# Patient Record
Sex: Male | Born: 1975 | Race: Black or African American | Hispanic: No | Marital: Single | Smoking: Current every day smoker
Health system: Southern US, Community
[De-identification: ages and names within clinical notes are randomized; demographics above are authoritative.]

## PROBLEM LIST (undated history)

## (undated) DIAGNOSIS — F4310 Post-traumatic stress disorder, unspecified: Secondary | ICD-10-CM

## (undated) DIAGNOSIS — I10 Essential (primary) hypertension: Secondary | ICD-10-CM

## (undated) DIAGNOSIS — R569 Unspecified convulsions: Secondary | ICD-10-CM

---

## 2019-11-23 ENCOUNTER — Other Ambulatory Visit: Payer: Self-pay

## 2019-11-23 ENCOUNTER — Emergency Department (HOSPITAL_COMMUNITY)
Admission: EM | Admit: 2019-11-23 | Discharge: 2019-11-25 | Disposition: A | Discharge status: Other Acute Inpatient Hospital | Payer: No Typology Code available for payment source | Attending: Emergency Medicine | Admitting: Emergency Medicine

## 2019-11-23 ENCOUNTER — Emergency Department (HOSPITAL_COMMUNITY): Payer: No Typology Code available for payment source

## 2019-11-23 ENCOUNTER — Encounter (HOSPITAL_COMMUNITY): Payer: Self-pay | Admitting: Emergency Medicine

## 2019-11-23 DIAGNOSIS — F209 Schizophrenia, unspecified: Secondary | ICD-10-CM | POA: Insufficient documentation

## 2019-11-23 DIAGNOSIS — F4310 Post-traumatic stress disorder, unspecified: Secondary | ICD-10-CM | POA: Insufficient documentation

## 2019-11-23 DIAGNOSIS — F17200 Nicotine dependence, unspecified, uncomplicated: Secondary | ICD-10-CM | POA: Diagnosis not present

## 2019-11-23 DIAGNOSIS — I10 Essential (primary) hypertension: Secondary | ICD-10-CM | POA: Diagnosis not present

## 2019-11-23 DIAGNOSIS — Z20822 Contact with and (suspected) exposure to covid-19: Secondary | ICD-10-CM | POA: Diagnosis not present

## 2019-11-23 DIAGNOSIS — Z79899 Other long term (current) drug therapy: Secondary | ICD-10-CM | POA: Insufficient documentation

## 2019-11-23 DIAGNOSIS — R45851 Suicidal ideations: Secondary | ICD-10-CM | POA: Diagnosis not present

## 2019-11-23 DIAGNOSIS — R079 Chest pain, unspecified: Secondary | ICD-10-CM | POA: Diagnosis not present

## 2019-11-23 HISTORY — DX: Post-traumatic stress disorder, unspecified: F43.10

## 2019-11-23 HISTORY — DX: Essential (primary) hypertension: I10

## 2019-11-23 HISTORY — DX: Unspecified convulsions: R56.9

## 2019-11-23 LAB — RAPID URINE DRUG SCREEN, HOSP PERFORMED
Amphetamines: NOT DETECTED
Barbiturates: NOT DETECTED
Benzodiazepines: NOT DETECTED
Cocaine: NOT DETECTED
Opiates: NOT DETECTED
Tetrahydrocannabinol: POSITIVE — AB

## 2019-11-23 LAB — COMPREHENSIVE METABOLIC PANEL
ALT: 77 U/L — ABNORMAL HIGH (ref 0–44)
AST: 254 U/L — ABNORMAL HIGH (ref 15–41)
Albumin: 4.9 g/dL (ref 3.5–5.0)
Alkaline Phosphatase: 73 U/L (ref 38–126)
Anion gap: 16 — ABNORMAL HIGH (ref 5–15)
BUN: 8 mg/dL (ref 6–20)
CO2: 24 mmol/L (ref 22–32)
Calcium: 8.9 mg/dL (ref 8.9–10.3)
Chloride: 102 mmol/L (ref 98–111)
Creatinine, Ser: 0.97 mg/dL (ref 0.61–1.24)
GFR, Estimated: >60 mL/min (ref >60)
Glucose, Bld: 122 mg/dL — ABNORMAL HIGH (ref 70–99)
Potassium: 2.9 mmol/L — ABNORMAL LOW (ref 3.5–5.1)
Sodium: 142 mmol/L (ref 135–145)
Total Bilirubin: 0.6 mg/dL (ref 0.3–1.2)
Total Protein: 8.3 g/dL — ABNORMAL HIGH (ref 6.5–8.1)

## 2019-11-23 LAB — CBC WITH DIFFERENTIAL/PLATELET
Abs Immature Granulocytes: 0.01 10*3/uL (ref 0.00–0.07)
Basophils Absolute: 0 10*3/uL (ref 0.0–0.1)
Basophils Relative: 0 %
Eosinophils Absolute: 0.1 10*3/uL (ref 0.0–0.5)
Eosinophils Relative: 1 %
HCT: 46.5 % (ref 39.0–52.0)
Hemoglobin: 15.4 g/dL (ref 13.0–17.0)
Immature Granulocytes: 0 %
Lymphocytes Relative: 38 %
Lymphs Abs: 4 10*3/uL (ref 0.7–4.0)
MCH: 25.2 pg — ABNORMAL LOW (ref 26.0–34.0)
MCHC: 33.1 g/dL (ref 30.0–36.0)
MCV: 76 fL — ABNORMAL LOW (ref 80.0–100.0)
Monocytes Absolute: 0.8 10*3/uL (ref 0.1–1.0)
Monocytes Relative: 8 %
Neutro Abs: 5.6 10*3/uL (ref 1.7–7.7)
Neutrophils Relative %: 53 %
Platelets: 229 10*3/uL (ref 150–400)
RBC: 6.12 MIL/uL — ABNORMAL HIGH (ref 4.22–5.81)
RDW: 17.9 % — ABNORMAL HIGH (ref 11.5–15.5)
WBC: 10.6 10*3/uL — ABNORMAL HIGH (ref 4.0–10.5)
nRBC: 0 % (ref 0.0–0.2)

## 2019-11-23 LAB — RESPIRATORY PANEL BY RT PCR (FLU A&B, COVID)
Influenza A by PCR: NEGATIVE
Influenza B by PCR: NEGATIVE
SARS Coronavirus 2 by RT PCR: NEGATIVE

## 2019-11-23 LAB — ETHANOL: Alcohol, Ethyl (B): 254 mg/dL — ABNORMAL HIGH (ref <10)

## 2019-11-23 LAB — TROPONIN I (HIGH SENSITIVITY)
Troponin I (High Sensitivity): 33 ng/L — ABNORMAL HIGH (ref <18)
Troponin I (High Sensitivity): 31 ng/L — ABNORMAL HIGH (ref <18)

## 2019-11-23 MED ORDER — LORAZEPAM 1 MG PO TABS
2.0000 mg | ORAL_TABLET | Freq: Once | ORAL | Status: AC
  Administered 2019-11-23: 2 mg via ORAL
  Filled 2019-11-23: qty 2

## 2019-11-23 MED ORDER — AMLODIPINE BESYLATE 5 MG PO TABS
10.0000 mg | ORAL_TABLET | Freq: Every day | ORAL | Status: DC
  Administered 2019-11-24 – 2019-11-25 (×2): 10 mg via ORAL
  Filled 2019-11-23 (×2): qty 2

## 2019-11-23 MED ORDER — PANTOPRAZOLE SODIUM 40 MG PO TBEC
40.0000 mg | DELAYED_RELEASE_TABLET | Freq: Every day | ORAL | Status: DC
  Administered 2019-11-24 – 2019-11-25 (×2): 40 mg via ORAL
  Filled 2019-11-23 (×2): qty 1

## 2019-11-23 MED ORDER — PRAZOSIN HCL 5 MG PO CAPS
5.0000 mg | ORAL_CAPSULE | Freq: Every day | ORAL | Status: DC
  Administered 2019-11-23 – 2019-11-24 (×2): 5 mg via ORAL
  Filled 2019-11-23 (×5): qty 1

## 2019-11-23 MED ORDER — AMITRIPTYLINE HCL 25 MG PO TABS
12.5000 mg | ORAL_TABLET | Freq: Two times a day (BID) | ORAL | Status: DC
  Administered 2019-11-23 – 2019-11-24 (×3): 12.5 mg via ORAL
  Filled 2019-11-23 (×11): qty 0.5

## 2019-11-23 MED ORDER — TRAZODONE HCL 50 MG PO TABS
100.0000 mg | ORAL_TABLET | Freq: Every day | ORAL | Status: DC
  Administered 2019-11-23 – 2019-11-24 (×2): 100 mg via ORAL
  Filled 2019-11-23 (×2): qty 2

## 2019-11-23 MED ORDER — FOLIC ACID 1 MG PO TABS
1.0000 mg | ORAL_TABLET | Freq: Every day | ORAL | Status: DC
  Administered 2019-11-24 – 2019-11-25 (×2): 1 mg via ORAL
  Filled 2019-11-23 (×2): qty 1

## 2019-11-23 MED ORDER — SERTRALINE HCL 50 MG PO TABS
100.0000 mg | ORAL_TABLET | Freq: Every day | ORAL | Status: DC
  Administered 2019-11-24 – 2019-11-25 (×2): 100 mg via ORAL
  Filled 2019-11-23 (×2): qty 2

## 2019-11-23 MED ORDER — CYCLOBENZAPRINE HCL 10 MG PO TABS: 10.0000 mg | ORAL_TABLET | Freq: Three times a day (TID) | ORAL | Status: DC | PRN

## 2019-11-23 MED ORDER — ROSUVASTATIN CALCIUM 5 MG PO TABS
5.0000 mg | ORAL_TABLET | Freq: Every day | ORAL | Status: DC
  Administered 2019-11-24 – 2019-11-25 (×2): 5 mg via ORAL
  Filled 2019-11-23 (×5): qty 1

## 2019-11-23 MED ORDER — NALTREXONE HCL 50 MG PO TABS
50.0000 mg | ORAL_TABLET | Freq: Every day | ORAL | Status: DC
  Administered 2019-11-24 – 2019-11-25 (×2): 50 mg via ORAL
  Filled 2019-11-23 (×5): qty 1

## 2019-11-23 MED ORDER — POTASSIUM CHLORIDE CRYS ER 20 MEQ PO TBCR
40.0000 meq | EXTENDED_RELEASE_TABLET | Freq: Once | ORAL | Status: AC
  Administered 2019-11-23: 40 meq via ORAL
  Filled 2019-11-23: qty 2

## 2019-11-23 MED ORDER — CARVEDILOL 12.5 MG PO TABS
12.5000 mg | ORAL_TABLET | Freq: Two times a day (BID) | ORAL | Status: DC
  Administered 2019-11-24 – 2019-11-25 (×3): 12.5 mg via ORAL
  Filled 2019-11-23 (×3): qty 1

## 2019-11-23 NOTE — ED Notes (Signed)
Called AC for meds.  

## 2019-11-23 NOTE — ED Provider Notes (Signed)
[facility] EMERGENCY DEPARTMENT Provider Note   CSN: [id number] Arrival date & time: 11/23/19  1913     History Chief Complaint  Patient presents with  . Suicidal    Jonathan Gomez is a 44 y.o. male.  Patient presented with suicidal ideations. While he was in the emergency department he had mild chest discomfort.  The history is provided by the patient and medical records. No language interpreter was used.  Altered Mental Status Presenting symptoms: behavior changes   Severity:  Moderate Most recent episode:  Today Episode history:  Continuous Timing:  Constant Progression:  Waxing and waning Chronicity:  New Context: not alcohol use   Associated symptoms: no abdominal pain, no hallucinations, no headaches, no rash and no seizures        Past Medical History:  Diagnosis Date  . Hypertension   . PTSD (post-traumatic stress disorder)   . Seizures (HCC)     There are no problems to display for this patient.   History reviewed. No pertinent surgical history.     No family history on file.  Social History   Tobacco Use  . Smoking status: Current Every Day Smoker  Substance Use Topics  . Alcohol use: Yes  . Drug use: Not on file    Home Medications Prior to Admission medications   Medication Sig Start Date End Date Taking? Authorizing Provider  amLODipine (NORVASC) 10 MG tablet Take by mouth. 03/25/18  Yes [provider]  carvedilol (COREG) 12.5 MG tablet Take 12.5 mg by mouth 2 (two) times daily with a meal.   Yes [provider]  cyclobenzaprine (FLEXERIL) 10 MG tablet Take 10 mg by mouth 3 (three) times daily as needed for muscle spasms.   Yes [provider]  folic acid (FOLVITE) 1 MG tablet Take by mouth. 03/25/18  Yes [provider]  naltrexone (DEPADE) 50 MG tablet Take 50 mg by mouth daily.   Yes [provider]  omeprazole (PRILOSEC) 20 MG capsule Take 20 mg by mouth daily.   Yes [provider]   sertraline (ZOLOFT) 100 MG tablet Take by mouth.   Yes [provider]  traZODone (DESYREL) 100 MG tablet Take 100 mg by mouth at bedtime.   Yes [provider]  amitriptyline (ELAVIL) 25 MG tablet Take 25 mg by mouth See admin instructions. Take 1/2 tablet by mouth twice a day at 7am and 9 pm    [provider]  prazosin (MINIPRESS) 5 MG capsule Take by mouth.    [provider]  rosuvastatin (CRESTOR) 5 MG tablet Take by mouth.    [provider]    Allergies    Lisinopril  Review of Systems   Review of Systems  Constitutional: Negative for appetite change and fatigue.  HENT: Negative for congestion, ear discharge and sinus pressure.   Eyes: Negative for discharge.  Respiratory: Negative for cough.   Cardiovascular: Positive for chest pain.  Gastrointestinal: Negative for abdominal pain and diarrhea.  Genitourinary: Negative for frequency and hematuria.  Musculoskeletal: Negative for back pain.  Skin: Negative for rash.  Neurological: Negative for seizures and headaches.  Psychiatric/Behavioral: Positive for dysphoric mood. Negative for hallucinations.    Physical Exam Updated Vital Signs BP (!) 151/107 (BP Location: Right Arm)   Pulse (!) 118   Temp 98.2 F (36.8 C) (Oral)   Resp 20   Ht 5\' 10"  (1.778 m)   Wt 117.9 kg   SpO2 95%   BMI 37.31 kg/m  Physical Exam Vitals and nursing note reviewed.  Constitutional:      Appearance: He is well-developed.  HENT:     Head: Normocephalic.     Nose: Nose normal.  Eyes:     General: No scleral icterus.    Conjunctiva/sclera: Conjunctivae normal.  Neck:     Thyroid: No thyromegaly.  Cardiovascular:     Rate and Rhythm: Normal rate and regular rhythm.     Heart sounds: No murmur heard.  No friction rub. No gallop.   Pulmonary:     Breath sounds: No stridor. No wheezing or rales.  Chest:     Chest wall: No tenderness.  Abdominal:     General: There is no distension.      Tenderness: There is no abdominal tenderness. There is no rebound.  Musculoskeletal:        General: Normal range of motion.     Cervical back: Neck supple.  Lymphadenopathy:     Cervical: No cervical adenopathy.  Skin:    Findings: No erythema or rash.  Neurological:     Mental Status: He is alert and oriented to person, place, and time.     Motor: No abnormal muscle tone.     Coordination: Coordination normal.  Psychiatric:     Comments: Suicidal.     ED Results / Procedures / Treatments   Labs (all labs ordered are listed, but only abnormal results are displayed) Labs Reviewed  CBC WITH DIFFERENTIAL/PLATELET - Abnormal; Notable for the following components:      Result Value   WBC 10.6 (*)    RBC 6.12 (*)    MCV 76.0 (*)    MCH 25.2 (*)    RDW 17.9 (*)    All other components within normal limits  COMPREHENSIVE METABOLIC PANEL - Abnormal; Notable for the following components:   Potassium 2.9 (*)    Glucose, Bld 122 (*)    Total Protein 8.3 (*)    AST 254 (*)    ALT 77 (*)    Anion gap 16 (*)    All other components within normal limits  ETHANOL - Abnormal; Notable for the following components:   Alcohol, Ethyl (B) 254 (*)    All other components within normal limits  RAPID URINE DRUG SCREEN, HOSP PERFORMED - Abnormal; Notable for the following components:   Tetrahydrocannabinol POSITIVE (*)    All other components within normal limits  TROPONIN I (HIGH SENSITIVITY) - Abnormal; Notable for the following components:   Troponin I (High Sensitivity) 33 (*)    All other components within normal limits  RESPIRATORY PANEL BY RT PCR (FLU A&B, COVID)  TROPONIN I (HIGH SENSITIVITY)    EKG None  Radiology DG Chest Port 1 View  Result Date: 11/23/2019 CLINICAL DATA:  Short of breath, chest pain, hypertension, tobacco abuse EXAM: PORTABLE CHEST 1 VIEW COMPARISON:  None. FINDINGS: The heart size and mediastinal contours are within normal limits. Both lungs are clear. The  visualized skeletal structures are unremarkable. IMPRESSION: No active disease. Electronically Signed   By: Sharlet Salina M.D.   On: 11/23/2019 21:14    Procedures Procedures (including critical care time)  Medications Ordered in ED Medications  potassium chloride SA (KLOR-CON) CR tablet 40 mEq (has no administration in time range)  amitriptyline (ELAVIL) tablet 12.5 mg (has no administration in time range)  amLODipine (NORVASC) tablet 10 mg (has no administration in time range)  carvedilol (COREG) tablet 12.5 mg (has no administration in time range)  cyclobenzaprine (FLEXERIL) tablet 10 mg (has no administration in time range)  folic acid (FOLVITE) tablet 1 mg (has no administration in time range)  naltrexone (DEPADE) tablet 50 mg (has no administration in time range)  pantoprazole (PROTONIX) EC tablet 40 mg (has no administration in time range)  prazosin (MINIPRESS) capsule 5 mg (has no administration in time range)  rosuvastatin (CRESTOR) tablet 5 mg (has no administration in time range)  sertraline (ZOLOFT) tablet 100 mg (has no administration in time range)  traZODone (DESYREL) tablet 100 mg (has no administration in time range)  LORazepam (ATIVAN) tablet 2 mg (2 mg Oral Given 11/23/19 2024)    ED Course  I have reviewed the triage vital signs and the nursing notes.  Pertinent labs & imaging results that were available during my care of the patient were reviewed by me and considered in my medical decision making (see chart for details).    MDM Rules/Calculators/A&P                          Patient with suicidal ideations and chest discomfort with mild elevated troponin.  Patient will be observed in the emergency department tonight and have serial troponins done.  If he is medically cleared he will be seen by.  Behavior health for his suicidal ideations      This patient presents to the ED for concern of suicidal ideation, this involves an extensive number of treatment  options, and is a complaint that carries with it a high risk of complications and morbidity.  The differential diagnosis includes suicidal   Lab Tests:   I Ordered, reviewed, and interpreted labs, which included CBC chemistries troponin which showed mild elevated troponin hypokalemia  Medicines ordered:   I ordered medication potassium for hypokalemia  Imaging Studies ordered:   I ordered imaging studies which included chest x-ray  I independently visualized and interpreted imaging which showed unremarkable  Additional history obtained:   Additional history obtained from records  Previous records obtained and reviewed.  Consultations Obtained:   I consulted behavioral health and discussed lab and imaging findings  Reevaluation:  After the interventions stated above, I reevaluated the patient and found unchanged  Critical Interventions:  .   Final Clinical Impression(s) / ED Diagnoses Final diagnoses:  None    Rx / DC Orders ED Discharge Orders    None       Bethann Berkshire, MD 11/27/19 1020

## 2019-11-23 NOTE — BH Assessment (Signed)
Comprehensive Clinical Assessment (CCA) Note  11/23/2019 Jonathan Gomez [id number]   Jael Kostick is a 44 year old male who presents to [facility] voluntarily for suicidal thoughts and PTSD. During assessment pt presents to be thought blocking and paranoia but pleasant and provided limited information. Pt states that he has had suicidal thoughts since this past weekend, states he is not sure what triggered thoughts but he does admit to drinking alcohol past few days, states he relapsed has not drank since February 2021. Pt admits to drinking a 5th of liquor and yesterday he states he drank a gallon of liquor. Pt denies HI but admits to [facility] states he hears voices telling him to kill self and admits that he had planned to drown himself this week with SI thoughts. Pt reports he has PTSD from [employer], he was in service for 24 years, also has TBI and history of seizures. Pt states his provider is the [place], states he has not been to [place] since June 2021 and states he is taking several medications including Prozac daily. Pt reports getting only 4 hours of sleep daily and poor appetite. Pt reports that he would like inpatient treatment for detox as well. Pt gave TTS permission to speak with spouse for collateral information, Coy Saunas at [phone], she states that pt has a hx of schizophrenia and PTSD, states that his schizophrenia is only triggered when he drinks alcohol. She states that pt has been experiencing suicidal thoughts last few weeks and threatened to kill himself as well as her. She states he had  Plan to drown himself and or shoot self with firearm. She states that pt is med compliant daily, she is not sure what triggered his SI thoughts, states he has been drinking more no other triggers. Pt seeking inpatient treatment and spouse in agreement, states pt has not had an episode in the last year she is concerned.   Diagnosis: PTSD         Schizophrenia  Disposition: Nira Conn FNP recommends pt  for inpatient treatment as well as Social Work consult pt requesting to go to [place]. Per [facility] no appropiate bed at [facility] at this time.   Chief Complaint:  Chief Complaint  Patient presents with  . Suicidal   Visit Diagnosis: suicdal, PTSD   CCA Screening, Triage and Referral (STR)  Patient Reported Information How did you hear about [place]? Family/Friend  Referral name: Spouse (Spouse)  Referral phone number: No data recorded  Whom do you see for routine medical problems? Primary Care  Practice/Facility Name: [facility])  Practice/Facility Phone Number: No data recorded Name of Contact: No data recorded Contact Number: No data recorded Contact Fax Number: No data recorded Prescriber Name: No data recorded Prescriber Address (if known): No data recorded  What Is the Reason for Your Visit/Call Today? Suicidal and detox from alcohol (Suicidal and detox from alcohol)  How Long Has This Been Causing You Problems? <Week  What Do You Feel Would Help You the Most Today? Therapy;Medication   Have You Recently Been in Any Inpatient Treatment (Hospital/Detox/Crisis Center/28-Day Program)? No  Name/Location of Program/Hospital:No data recorded How Long Were You There? No data recorded When Were You Discharged? No data recorded  Have You Ever Received Services From [facility] Before? No  Who Do You See at [facility]? No data recorded  Have You Recently Had Any Thoughts About Hurting Yourself? Yes  Are You Planning to Commit Suicide/Harm Yourself At This time? Yes  Have you Recently Had Thoughts About Hurting Someone Karolee Ohs? No  Explanation: No data recorded  Have You Used Any Alcohol or Drugs in the Past 24 Hours? Yes  How Long Ago Did You Use Drugs or Alcohol? No data recorded What Did You Use and How Much? 1/5th of liquor (1/5th of liquor)   Do You Currently Have a Therapist/Psychiatrist? Yes  [facility]   Have You Been  Recently Discharged From Any Office Practice or Programs? No  Explanation of Discharge From Practice/Program: No data recorded    CCA Screening Triage Referral Assessment Type of Contact: Tele-Assessment  Is this Initial or Reassessment? Initial Assessment  Date Telepsych consult ordered in [facility]:  11/23/19 (11/23/2019)  Time Telepsych consult ordered in [facility]:  No data recorded  Patient Reported Information Reviewed? Yes  Patient Left Without Being Seen? No data recorded Reason for Not Completing Assessment: No data recorded  Collateral Involvement: spouse/girlfriend (spouse/girlfriend)   Does Patient Have a Court Appointed Legal Guardian? No data recorded Name and Contact of Legal Guardian: No data recorded If Minor and Not Living with Parent(s), Who has Custody? No data recorded Is CPS involved or ever been involved? Never  Is APS involved or ever been involved? Never   Patient Determined To Be At Risk for Harm To Self or Others Based on Review of Patient Reported Information or Presenting Complaint? No  Method: No data recorded Availability of Means: No data recorded Intent: No data recorded Notification Required: No data recorded Additional Information for Danger to Others Potential: No data recorded Additional Comments for Danger to Others Potential: No data recorded Are There Guns or Other Weapons in Your Home? No data recorded Types of Guns/Weapons: No data recorded Are These Weapons Safely Secured?                            No data recorded Who Could Verify You Are Able To Have These Secured: No data recorded Do You Have any Outstanding Charges, Pending Court Dates, Parole/Probation? No data recorded Contacted To Inform of Risk of Harm To Self or Others: No data recorded  Location of Assessment: [facility]   Does Patient Present under Involuntary Commitment? No  IVC Papers Initial File Date: No data recorded  [place] of Residence: [place]   Patient Currently  Receiving the Following Services: Medication Management   Determination of Need: Urgent (48 hours)   Options For Referral: Other: Comment     CCA Biopsychosocial  Intake/Chief Complaint:  No data recorded  Patient Reported Schizophrenia/Schizoaffective Diagnosis in Past: Yes   Mental Health Symptoms Depression:  No data recorded  Duration of Depressive symptoms: No data recorded  Mania:  Change in energy/activity   Anxiety:   No data recorded  Psychosis:  Hallucinations;Other negative symptoms   Duration of Psychotic symptoms: Greater than six months   Trauma:  Hypervigilance;Irritability/anger;Re-experience of traumatic event;Difficulty staying/falling asleep   Obsessions:  No data recorded  Compulsions:  No data recorded  Inattention:  No data recorded  Hyperactivity/Impulsivity:  No data recorded  Oppositional/Defiant Behaviors:  No data recorded  Emotional Irregularity:  No data recorded  Other Mood/Personality Symptoms:  No data recorded   Mental Status Exam Appearance and self-care  Stature:  Average   Weight:  Average weight   Clothing:  Casual   Grooming:  Normal   Cosmetic use:  Age appropriate   Posture/gait:  Normal   Motor activity:  No  data recorded  Sensorium  Attention:  Distractible   Concentration:  Focuses on irrelevancies   Orientation:  Object;Person   Recall/memory:  Normal   Affect and Mood  Affect:  Other (Comment)   Mood:  Euphoric   Relating  Eye contact:  Staring   Facial expression:  Responsive   Attitude toward examiner:  Nutritional therapistarcastic;Dramatic   Thought and Language  Speech flow: Flight of Ideas;Other (Comment);Blocked   Thought content:  Appropriate to Mood and Circumstances   Preoccupation:  Other (Comment)   Hallucinations:  Auditory;Visual;Other (Comment)   Organization:  No data recorded  Affiliated Computer ServicesExecutive Functions  Fund of Knowledge:  Fair   Intelligence:  Average   Abstraction:  Functional   Judgement:   Fair   Reality Testing:  Variable   Insight:  Gaps;Fair   Decision Making:  Vacilates   Social Functioning  Social Maturity:  Irresponsible   Social Judgement:  Normal   Stress  Stressors:  Other (Comment)   Coping Ability:  Normal   Skill Deficits:  None   Supports:  Family      Religion: Religion/Spirituality Are You A Religious Person?: Yes What is Your Religious Affiliation?: Environmental consultantBaptist  Leisure/Recreation: Leisure / Recreation Do You Have Hobbies?: Yes Leisure and Hobbies:  Loss adjuster, chartered(Football)  Exercise/Diet: Exercise/Diet Have You Gained or Lost A Significant Amount of Weight in the Past Six Months?: Yes-Gained Do You Follow a Special Diet?: No Do You Have Any Trouble Sleeping?: Yes Explanation of Sleeping Difficulties: Trauma/nightmares (Trauma)   CCA Employment/Education  Employment/Work Situation: Employment / Work Situation Employment situation: On disability Has patient ever been in the [employer] and Compan[employer]ilitary?: Yes (Describe in comment)  Education: Education Did You Have An Individualized Education Program (IIEP): No Did You Have Any Difficulty At Progress EnergySchool?: No Patient's Education Has Been Impacted by Current Illness: No   CCA Family/Childhood History  Family and Relationship History: Family history Marital status: Long term relationship Does patient have children?: Yes How many children?: 2  Childhood History:  Childhood History By whom was/is the patient raised?: Both parents Does patient have siblings?: Yes Number of Siblings: 2 Did patient suffer any verbal/emotional/physical/sexual abuse as a child?: No Did patient suffer from severe childhood neglect?: No Has patient ever been sexually abused/assaulted/raped as an adolescent or adult?: No Was the patient ever a victim of a crime or a disaster?: No Witnessed domestic violence?: No Has patient been affected by domestic violence as an adult?: No  Child/Adolescent Assessment:     CCA Substance  Use  Alcohol/Drug Use: Alcohol / Drug Use Pain Medications: see MAR History of alcohol / drug use?: Yes Withdrawal Symptoms: Seizures Substance #1 Name of Substance 1:  (Alcohol) 1 - Frequency:  (1/5th of liquor) 1 - Last Use / Amount:  (last 24 hours)      ASAM's:  Six Dimensions of Multidimensional Assessment  Dimension 1:  Acute Intoxication and/or Withdrawal Potential:   Dimension 1:  Description of individual's past and current experiences of substance use and withdrawal: 1  Dimension 2:  Biomedical Conditions and Complications:   Dimension 2:  Description of patient's biomedical conditions and  complications: 2  Dimension 3:  Emotional, Behavioral, or Cognitive Conditions and Complications:  Dimension 3:  Description of emotional, behavioral, or cognitive conditions and complications: 3  Dimension 4:  Readiness to Change:  Dimension 4:  Description of Readiness to Change criteria: 2  Dimension 5:  Relapse, Continued use, or Continued Problem Potential:  Dimension 5:  Relapse, continued use, or continued  problem potential critiera description: 2  Dimension 6:  Recovery/Living Environment:  Dimension 6:  Recovery/Iiving environment criteria description: 2  ASAM Severity Score: ASAM's Severity Rating Score: 12  ASAM Recommended Level of Treatment:     Substance use Disorder (SUD) Substance Use Disorder (SUD)  Checklist Symptoms of Substance Use: Continued use despite having a persistent/recurrent physical/psychological problem caused/exacerbated by use  Recommendations for Services/Supports/Treatments: Recommendations for Services/Supports/Treatments Recommendations For Services/Supports/Treatments: Inpatient Hospitalization  DSM5 Diagnoses: There are no problems to display for this patient.   Patient Centered Plan: Patient is on the following Treatment Plan(s):     Referrals to Alternative Service(s): Referred to Alternative Service(s):   Place:   Date:   Time:     Referred to Alternative Service(s):   Place:   Date:   Time:    Referred to Alternative Service(s):   Place:   Date:   Time:    Referred to Alternative Service(s):   Place:   Date:   Time:       Natasha Mead, LCSWA
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
## 2019-11-23 NOTE — ED Triage Notes (Signed)
Pt states he has been having SI thoughts since Thursday. Pt has extended military history with PTSD.

## 2019-11-24 ENCOUNTER — Emergency Department (HOSPITAL_COMMUNITY): Payer: No Typology Code available for payment source

## 2019-11-24 LAB — TROPONIN I (HIGH SENSITIVITY): Troponin I (High Sensitivity): 29 ng/L — ABNORMAL HIGH (ref <18)

## 2019-11-24 MED ORDER — SODIUM CHLORIDE 0.9 % IV BOLUS
1000.0000 mL | Freq: Once | INTRAVENOUS | Status: AC
  Administered 2019-11-24: 1000 mL via INTRAVENOUS

## 2019-11-24 MED ORDER — IOHEXOL 350 MG/ML SOLN
100.0000 mL | Freq: Once | INTRAVENOUS | Status: AC | PRN
  Administered 2019-11-24: 100 mL via INTRAVENOUS

## 2019-11-24 MED ORDER — NICOTINE 21 MG/24HR TD PT24
21.0000 mg | MEDICATED_PATCH | Freq: Once | TRANSDERMAL | Status: AC
  Administered 2019-11-24: 21 mg via TRANSDERMAL
  Filled 2019-11-24: qty 1

## 2019-11-24 NOTE — ED Provider Notes (Signed)
Patient was signed out to me to follow-up on cardiac enzymes.  Patient was seen earlier, primarily for suicidal ideation.  Involuntary commitment was initiated.  While here in the department, however, he reported he had chest pain.  It was felt that his chest pain was atypical, however first troponin was elevated.  Subsequent troponins have remained steadily elevated but have actually decreased.  This is not consistent with an acute coronary syndrome.  He is tachycardic.  Likely secondary to anxiousness/agitation.  Patient administered IV fluids.  CT chest does not show any obvious PE however was suboptimal opacification calculation unlikely to have a PE.  Patient now medically clear for psychiatric treatment.Gilda Crease, MD 11/24/19 [phone]

## 2019-11-24 NOTE — Progress Notes (Addendum)
CSW contacted admissions at the [place] in [place], [place]. They are currently on mental health capacity alert and are not accepting new pts at this time. It was assessed that pt is linked with the [facility] and a voice message requesting a return phone call was left with admissions at [facility].   Disposition will continue to follow.    Wells Guiles, MSW, LCSW, LCAS Clinical Social Worker II Disposition CSW [phone]    UPDATE: CSW spoke with Steward Drone at the [facility]. She verified that beds are available today. CSW has faxed all needed documentation except the Patient Consent for Transfer form and [facility] [place] Transfer Information form. Meghan, CSW @ [facility] will assist with this. These forms are to be faxed to [phone]  UPDATE 230pm: CSW left message with Steward Drone in admissions at [facility]. [phone] ext. [phone]), requesting a return phone call to discuss . All needed referral information, including IVC has faxed to the [place].   240pm: Received phone call from [place]. [facility] Dortha Schwalbe [phone] ex. [phone]) is the assigned social worker who will be reviewing pt's referral. 2nd shift Disposition CSW to follow up.

## 2019-11-24 NOTE — Clinical Social Work Note (Signed)
CSW faxed copy of IVC paperwork and [facility] MD forms to [facility] [phone]).

## 2019-11-25 ENCOUNTER — Encounter (HOSPITAL_COMMUNITY): Payer: Self-pay | Admitting: Psychiatry

## 2019-11-25 ENCOUNTER — Other Ambulatory Visit: Payer: Self-pay

## 2019-11-25 ENCOUNTER — Inpatient Hospital Stay (HOSPITAL_COMMUNITY)
Admission: AD | Admit: 2019-11-25 | Discharge: 2019-11-28 | DRG: 897 | Disposition: A | Discharge status: Home / Self Care | Payer: No Typology Code available for payment source | Attending: Psychiatry | Admitting: Psychiatry

## 2019-11-25 DIAGNOSIS — F1020 Alcohol dependence, uncomplicated: Principal | ICD-10-CM | POA: Diagnosis present

## 2019-11-25 DIAGNOSIS — R45851 Suicidal ideations: Secondary | ICD-10-CM | POA: Diagnosis present

## 2019-11-25 DIAGNOSIS — K219 Gastro-esophageal reflux disease without esophagitis: Secondary | ICD-10-CM | POA: Diagnosis present

## 2019-11-25 DIAGNOSIS — F17210 Nicotine dependence, cigarettes, uncomplicated: Secondary | ICD-10-CM | POA: Diagnosis present

## 2019-11-25 DIAGNOSIS — I10 Essential (primary) hypertension: Secondary | ICD-10-CM | POA: Diagnosis present

## 2019-11-25 DIAGNOSIS — G4700 Insomnia, unspecified: Secondary | ICD-10-CM | POA: Diagnosis present

## 2019-11-25 DIAGNOSIS — F4310 Post-traumatic stress disorder, unspecified: Secondary | ICD-10-CM | POA: Diagnosis present

## 2019-11-25 DIAGNOSIS — F10188 Alcohol abuse with other alcohol-induced disorder: Secondary | ICD-10-CM | POA: Diagnosis not present

## 2019-11-25 MED ORDER — AMITRIPTYLINE HCL 25 MG PO TABS
12.5000 mg | ORAL_TABLET | Freq: Two times a day (BID) | ORAL | Status: DC
  Administered 2019-11-25: 12.5 mg via ORAL
  Filled 2019-11-25 (×4): qty 0.5
  Filled 2019-11-25: qty 1

## 2019-11-25 MED ORDER — ACETAMINOPHEN 325 MG PO TABS: 650.0000 mg | ORAL_TABLET | Freq: Four times a day (QID) | ORAL | Status: DC | PRN

## 2019-11-25 MED ORDER — MAGNESIUM HYDROXIDE 400 MG/5ML PO SUSP: 30.0000 mL | Freq: Every day | ORAL | Status: DC | PRN

## 2019-11-25 MED ORDER — THIAMINE HCL 100 MG PO TABS
100.0000 mg | ORAL_TABLET | Freq: Every day | ORAL | Status: DC
  Administered 2019-11-25 – 2019-11-28 (×4): 100 mg via ORAL
  Filled 2019-11-25 (×7): qty 1

## 2019-11-25 MED ORDER — ROSUVASTATIN CALCIUM 5 MG PO TABS
5.0000 mg | ORAL_TABLET | Freq: Every day | ORAL | Status: DC
  Administered 2019-11-26 – 2019-11-28 (×3): 5 mg via ORAL
  Filled 2019-11-25 (×5): qty 1

## 2019-11-25 MED ORDER — RISPERIDONE 2 MG PO TBDP: 2.0000 mg | ORAL_TABLET | Freq: Three times a day (TID) | ORAL | Status: DC | PRN

## 2019-11-25 MED ORDER — CARVEDILOL 12.5 MG PO TABS
12.5000 mg | ORAL_TABLET | Freq: Two times a day (BID) | ORAL | Status: DC
  Administered 2019-11-26: 12.5 mg via ORAL
  Filled 2019-11-25 (×4): qty 1

## 2019-11-25 MED ORDER — AMLODIPINE BESYLATE 10 MG PO TABS
10.0000 mg | ORAL_TABLET | Freq: Every day | ORAL | Status: DC
  Administered 2019-11-25 – 2019-11-26 (×2): 10 mg via ORAL
  Filled 2019-11-25 (×2): qty 1
  Filled 2019-11-25: qty 2
  Filled 2019-11-25: qty 1

## 2019-11-25 MED ORDER — HYDROXYZINE HCL 25 MG PO TABS
25.0000 mg | ORAL_TABLET | Freq: Three times a day (TID) | ORAL | Status: DC | PRN
  Administered 2019-11-25 – 2019-11-27 (×3): 25 mg via ORAL
  Filled 2019-11-25 (×3): qty 1

## 2019-11-25 MED ORDER — FOLIC ACID 1 MG PO TABS
1.0000 mg | ORAL_TABLET | Freq: Every day | ORAL | Status: DC
  Administered 2019-11-25 – 2019-11-28 (×4): 1 mg via ORAL
  Filled 2019-11-25 (×7): qty 1

## 2019-11-25 MED ORDER — PRAZOSIN HCL 5 MG PO CAPS
5.0000 mg | ORAL_CAPSULE | Freq: Every day | ORAL | Status: DC
  Administered 2019-11-25 – 2019-11-27 (×3): 5 mg via ORAL
  Filled 2019-11-25 (×6): qty 1

## 2019-11-25 MED ORDER — TRAZODONE HCL 100 MG PO TABS
100.0000 mg | ORAL_TABLET | Freq: Every evening | ORAL | Status: DC | PRN
  Administered 2019-11-26 – 2019-11-27 (×2): 100 mg via ORAL
  Filled 2019-11-25 (×2): qty 1

## 2019-11-25 MED ORDER — PANTOPRAZOLE SODIUM 40 MG PO TBEC
40.0000 mg | DELAYED_RELEASE_TABLET | Freq: Every day | ORAL | Status: DC
  Administered 2019-11-25 – 2019-11-28 (×4): 40 mg via ORAL
  Filled 2019-11-25 (×7): qty 1

## 2019-11-25 MED ORDER — CYCLOBENZAPRINE HCL 10 MG PO TABS: 10.0000 mg | ORAL_TABLET | Freq: Three times a day (TID) | ORAL | Status: DC | PRN

## 2019-11-25 MED ORDER — TRAZODONE HCL 50 MG PO TABS
50.0000 mg | ORAL_TABLET | Freq: Every evening | ORAL | Status: DC | PRN
  Administered 2019-11-25: 50 mg via ORAL
  Filled 2019-11-25: qty 1

## 2019-11-25 MED ORDER — NALTREXONE HCL 50 MG PO TABS
50.0000 mg | ORAL_TABLET | Freq: Every day | ORAL | Status: DC
  Administered 2019-11-26 – 2019-11-28 (×3): 50 mg via ORAL
  Filled 2019-11-25 (×5): qty 1

## 2019-11-25 MED ORDER — LORAZEPAM 1 MG PO TABS: 1.0000 mg | ORAL_TABLET | ORAL | Status: DC | PRN

## 2019-11-25 MED ORDER — ZIPRASIDONE MESYLATE 20 MG IM SOLR: 20.0000 mg | INTRAMUSCULAR | Status: DC | PRN

## 2019-11-25 MED ORDER — LORAZEPAM 1 MG PO TABS: 1.0000 mg | ORAL_TABLET | Freq: Four times a day (QID) | ORAL | Status: DC | PRN

## 2019-11-25 MED ORDER — ALUM & MAG HYDROXIDE-SIMETH 200-200-20 MG/5ML PO SUSP: 30.0000 mL | ORAL | Status: DC | PRN

## 2019-11-25 MED ORDER — SERTRALINE HCL 100 MG PO TABS
100.0000 mg | ORAL_TABLET | Freq: Every day | ORAL | Status: DC
  Administered 2019-11-25 – 2019-11-28 (×4): 100 mg via ORAL
  Filled 2019-11-25 (×7): qty 1

## 2019-11-25 NOTE — Tx Team (Signed)
Initial Treatment Plan 11/25/2019 7:10 PM Rayden Dock KZL:[id number]    PATIENT STRESSORS: Financial difficulties Health problems Marital or family conflict Substance abuse   PATIENT STRENGTHS: Ability for insight Average or above average intelligence Communication skills Supportive family/friends   PATIENT IDENTIFIED PROBLEMS: Alcohol Abuse  Auditory hallucination  Suicidal ideation                 DISCHARGE CRITERIA:  Ability to meet basic life and health needs Adequate post-discharge living arrangements Motivation to continue treatment in a less acute level of care  PRELIMINARY DISCHARGE PLAN: Attend aftercare/continuing care group Outpatient therapy Return to previous living arrangement  PATIENT/FAMILY INVOLVEMENT: This treatment plan has been presented to and reviewed with the patient, Adoni Greenough.  The patient and family have been given the opportunity to ask questions and make suggestions.  Clarene Critchley, RN 11/25/2019, 7:10 PM

## 2019-11-25 NOTE — ED Notes (Signed)
Pt ambulatory to BR. Steady gait.

## 2019-11-25 NOTE — Progress Notes (Addendum)
CSW left a voice message for Dortha Schwalbe [phone] ex. [phone]) with the [facility], requesting a return phone call to discuss bed availability.    Wells Guiles, MSW, LCSW, LCAS Clinical Social Worker II Disposition CSW [phone]   UPDATE: 1023am: CSW spoke with Jasmine December at the [facility] and [place] at the [place], all [facility] [place] hospitals are on mental health diversion today. [facility] will review pt for admission.

## 2019-11-25 NOTE — Progress Notes (Signed)
Pt accepted to [facility], bed 303-1     Nira Conn, FNP is the accepting provider.    Dr. Jola Babinski is the attending provider.    Call report to [phone]    [facility] @ [facility] notified.     Pt is under IVC and will be transported by Patent examiner.    Pt is scheduled to arrive at [facility] at 430pm.    Wells Guiles, MSW, LCSW, LCAS Clinical Social Worker II Disposition CSW [phone]

## 2019-11-25 NOTE — Progress Notes (Signed)
Admission Note: Patient is a 44 year old male admitted to the unit from [facility] under IVC status for symptoms of paranoia, alcohol abuse and SI/HI towards his wife and others.  States he is here to work on his anxiety and paranoia.  Patient presents with anxious affect and mood.  States he's been sober since February of this year but relapsed and drank some coolers.  Admission plan of care reviewed and consent signed.  Personal belongings and skin assessment completed.  Some abrasion noted on bilateral knees.  Patient oriented to the unit, staff and room.  Routine safety checks initiated.  Verbalizes understanding of unit rules and protocols.  Patient is safe on the unit.

## 2019-11-26 DIAGNOSIS — F4310 Post-traumatic stress disorder, unspecified: Secondary | ICD-10-CM | POA: Diagnosis not present

## 2019-11-26 DIAGNOSIS — F1020 Alcohol dependence, uncomplicated: Principal | ICD-10-CM

## 2019-11-26 LAB — COMPREHENSIVE METABOLIC PANEL
ALT: 77 U/L — ABNORMAL HIGH (ref 0–44)
AST: 159 U/L — ABNORMAL HIGH (ref 15–41)
Albumin: 4.5 g/dL (ref 3.5–5.0)
Alkaline Phosphatase: 72 U/L (ref 38–126)
Anion gap: 12 (ref 5–15)
BUN: 11 mg/dL (ref 6–20)
CO2: 28 mmol/L (ref 22–32)
Calcium: 9.5 mg/dL (ref 8.9–10.3)
Chloride: 101 mmol/L (ref 98–111)
Creatinine, Ser: 1.04 mg/dL (ref 0.61–1.24)
GFR, Estimated: >60 mL/min (ref >60)
Glucose, Bld: 96 mg/dL (ref 70–99)
Potassium: 3.7 mmol/L (ref 3.5–5.1)
Sodium: 141 mmol/L (ref 135–145)
Total Bilirubin: 1.6 mg/dL — ABNORMAL HIGH (ref 0.3–1.2)
Total Protein: 7.8 g/dL (ref 6.5–8.1)

## 2019-11-26 LAB — HEMOGLOBIN A1C
Hgb A1c MFr Bld: 5.5 % (ref 4.8–5.6)
Mean Plasma Glucose: 111.15 mg/dL

## 2019-11-26 LAB — LIPID PANEL
Cholesterol: 169 mg/dL (ref 0–200)
HDL: 45 mg/dL (ref >40)
LDL Cholesterol: 83 mg/dL (ref 0–99)
Total CHOL/HDL Ratio: 3.8 RATIO
Triglycerides: 207 mg/dL — ABNORMAL HIGH (ref <150)
VLDL: 41 mg/dL — ABNORMAL HIGH (ref 0–40)

## 2019-11-26 LAB — TSH: TSH: 1.859 u[IU]/mL (ref 0.350–4.500)

## 2019-11-26 MED ORDER — CARVEDILOL 12.5 MG PO TABS
12.5000 mg | ORAL_TABLET | Freq: Once | ORAL | Status: AC
  Administered 2019-11-26: 12.5 mg via ORAL
  Filled 2019-11-26: qty 1

## 2019-11-26 MED ORDER — POTASSIUM CHLORIDE CRYS ER 20 MEQ PO TBCR
20.0000 meq | EXTENDED_RELEASE_TABLET | Freq: Two times a day (BID) | ORAL | Status: AC
  Administered 2019-11-26 (×2): 20 meq via ORAL
  Filled 2019-11-26 (×2): qty 1

## 2019-11-26 MED ORDER — CARVEDILOL 25 MG PO TABS
25.0000 mg | ORAL_TABLET | Freq: Two times a day (BID) | ORAL | Status: DC
  Administered 2019-11-26 – 2019-11-28 (×4): 25 mg via ORAL
  Filled 2019-11-26 (×8): qty 1

## 2019-11-26 NOTE — [facility] Counselor (Signed)
Adult Comprehensive Assessment  Patient ID: Jonathan Gomez, male   DOB: [birth year], 44 y.o.   MRN: [id number]  Information Source: Information source: Patient  Current Stressors:  Patient states their primary concerns and needs for treatment are:: PTSD, anxiety, depression, paranoia that someone was trying to hurt him Patient states their goals for this hospitilization and ongoing recovery are:: Get back on track, understand that he cannot use Educational / Learning stressors: Denies stressors Employment / Job issues: Denies stressors Family Relationships: Kids are 76 and 25yo and they don't want to be around him now because they don't know what to expect from him, just went through a divorce in February Financial / Lack of resources (include bankruptcy): Denies stressors Housing / Lack of housing: Denies stressors Physical health (include injuries & life threatening diseases): "Why can't my mind be normal?"  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  Experiences seizures at times. Social relationships: Feels like since he was gone for awhile, people have moved on and sometimes he thinks the people in his life are users. Substance abuse: Alcohol and marijuana are used to cope. Was sober from February to early November 2021.  Just recently started using marijuana. Bereavement / Loss: Mother died in 05-15-11  Living/Environment/Situation:  Living Arrangements: Spouse/significant other Living conditions (as described by patient or guardian): Good, decent Who else lives in the home?: Domestic partner How long has patient lived in current situation?: Since February 2021 What is atmosphere in current home: Comfortable, Paramedic, Supportive  Family History:  Marital status: Long term relationship Divorced, when?: February 2021 Long term relationship, how long?: February 2021 What types of issues is patient dealing with in the relationship?: Feeling numb to her feelings Are you sexually active?:  Yes What is your sexual orientation?: Heterosexual Does patient have children?: Yes How many children?: 2 How is patient's relationship with their children?: 25yo and 18yo daughters - oldest is working on her Master's degree, youngest is a Printmaker in college -- they are still focused on the past things he has done.  Childhood History:  By whom was/is the patient raised?: Both parents Description of patient's relationship with caregiver when they were a child: Good, ordinary Patient's description of current relationship with people who raised him/her: Mother is deceased, has a good relationship with father, probably better than when he was a child How were you disciplined when you got in trouble as a child/adolescent?: Whoopings Does patient have siblings?: Yes Number of Siblings: 2 Description of patient's current relationship with siblings: Older sister does not understand his disease, thinks he should "do cold [place]" - younger brother understands but not really Did patient suffer any verbal/emotional/physical/sexual abuse as a child?: No Did patient suffer from severe childhood neglect?: No Has patient ever been sexually abused/assaulted/raped as an adolescent or adult?: No Was the patient ever a victim of a crime or a disaster?: No Witnessed domestic violence?: No Has patient been affected by domestic violence as an adult?: No  Education:  Highest grade of school patient has completed: Some college Currently a Consulting civil engineer?: No Learning disability?: No  Employment/Work Situation:   Employment situation: On disability Why is patient on disability: Mental, physical How long has patient been on disability: 14-May-2017 What is the longest time patient has a held a job?: 21 years Where was the patient employed at that time?: [employer] Has patient ever been in the [employer]?: Yes (Describe in comment) Furniture conservator/restorer, was in combat 4 times)  Financial Resources:  Financial resources: [employer],  [employer] Does patient have a Lawyer or guardian?: No  Alcohol/Substance Abuse:   What has been your use of drugs/alcohol within the last 12 months?: Alcohol - was sober February-November 2021.  Marijuana - just started back recently Alcohol/Substance Abuse Treatment Hx: Past detox, Past Tx, Inpatient If yes, describe treatment: Once a year since leaving the military in 2-017 Has alcohol/substance abuse ever caused legal problems?: Yes  Social Support System:   Patient's Community Support System: Good Describe Community Support System: Father, girlfriend, sister, brother, children Type of faith/religion: "I've been backsliding."  [employer] How does patient's faith help to cope with current illness?: "I haven't been doing like I'm supposed to."  Leisure/Recreation:   Do You Have Hobbies?: Yes Leisure and Hobbies: Listening to music, working out  Strengths/Needs:   What is the patient's perception of their strengths?: Love for others, care about people and their wellbeing, charismatic Patient states they can use these personal strengths during their treatment to contribute to their recovery: Be disciplined and stop getting overconfident, make a schedule and put on the refrigerator of things that need to be done so can see it and feel accomplished. Patient states these barriers may affect/interfere with their treatment: None Patient states these barriers may affect their return to the community: None Other important information patient would like considered in planning for their treatment: None  Discharge Plan:   Currently receiving community mental health services: Yes (From Whom) Octavio Manns for therapy and medication management) Patient states concerns and preferences for aftercare planning are: [facility] - PTSD and substance abuse classes Patient states they will know when they are safe and ready for discharge when: "Anytime, I'm good to go now." Does patient have  access to transportation?: Yes Does patient have financial barriers related to discharge medications?: No Patient description of barriers related to discharge medications: Insurance and income Will patient be returning to same living situation after discharge?: Yes  Summary/Recommendations:   Summary and Recommendations (to be completed by the evaluator): Patient is a 44yo male admitted with suicidal thoughts with a plan to drown himself this weekend, relapse on alcohol, and auditory hallucinations telling him to kill himself.   He has PTSD from 24 years of [employer], states he saw combat 4 times.  He is on disability with a history of TBI and seizures.  His significant other states that he is only triggered when he drinks alcohol and has threatened to kill both himself and her.  He divorced in February 2021, got into a relationship with significant other in February 2021, and stopped drinking in February 2021.  He relapsed on alcohol and started using marijuana again in early November.  [facility] [place] [facility] his PTSD and alcohol groups, individual therapy and medication management.  Patient will benefit from crisis stabilization, medication evaluation, group therapy and psychoeducation, in addition to case management for discharge planning.  At discharge it is recommended that Patient adhere to the established discharge plan and continue in treatment.  Lynnell Chad. 11/26/2019

## 2019-11-26 NOTE — Progress Notes (Signed)
Pt denies any withdrawal symptoms besides some anxiety. Shares that he has been drinking heavily since he was serving in the [employer]. Shares that he has been irritable lately from "people trying to benefit me." Also endorses paranoia that someone is going to kill him but he doesn't know who. Reports stressor as divorce in the past year. His goal is to work on developing compassion for others and to work on staying sober. Pt denies SI/HI and AVH.Active listening, reassurance, and support provided. Medications administered as ordered by MD. Q 15 min safety checks continue. Pt's safety has been maintained.  Pt's blood pressure has decreased from 179/123 at 1855 to 155/108 at 2145. Pt was administered 10 mg of Norvasc at 1925 on day shift and he was administered his scheduled 5 mg of prazosin at 2118. PA, Karel Jarvis was informed.   11/25/19 2145  Psych Admission Type (Psych Patients Only)  Admission Status Involuntary  Psychosocial Assessment  Patient Complaints Anxiety;Depression;Sadness;Substance abuse;Irritability  Eye Contact Fair  Facial Expression Animated  Affect Appropriate to circumstance  Speech Logical/coherent  Interaction Assertive  Motor Activity Slow  Appearance/Hygiene In scrubs;Disheveled  Behavior Characteristics Cooperative;Anxious;Fidgety  Mood Depressed;Anxious;Pleasant  Thought Process  Coherency Tangential  Content Blaming others  Delusions Paranoid  Perception WDL  Hallucination None reported or observed  Judgment Limited  Confusion None  Danger to Self  Current suicidal ideation? Denies  Danger to Others  Danger to Others None reported or observed

## 2019-11-26 NOTE — H&P (Signed)
Psychiatric Admission Assessment Adult  Patient Identification: Jonathan Gomez MRN:  [id number] Date of Evaluation:  11/26/2019 Chief Complaint:  Alcohol use disorder, severe, dependence (HCC) [F10.20] Principal Diagnosis: <principal problem not specified> Diagnosis:  Active Problems:   Alcohol use disorder, severe, dependence (HCC)  History of Present Illness: Patient is seen and examined.  Patient is a 44 year old male with a past psychiatric history significant for posttraumatic stress disorder, depression and alcohol use disorder who originally presented to the [facility] emergency department on 11/23/2019 with suicidal ideation.  The patient stated that he had had suicidal ideation over the weekend.  He stated that he had had unspecified triggers that made him think about his previous trauma.  He stated that he had had several tours in [place] as well as [place], and saw multiple dead bodies.  He started thinking about that again and relapsed on alcohol.  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  He stated that he had been at the [facility] in [place] once in 2020 and once in 2021 for alcohol related issues.  He apparently was there for at least 30 days.  He mentions something about schizophrenia in the clinical comprehensive evaluation note, but review of the electronic medical record did not reveal any previous history of schizophrenia.  He apparently had developed some chest pain, and was kept in the emergency department.  His cardiac enzymes were negative.  CT scan of the chest did not reveal any obvious pulmonary emboli.  He was felt to be cleared psychiatrically for transfer.  Request were sent to the [facility], but apparently no acute beds were available.  Request were also sent to the [facility] but apparently they also were on diversion.  The patient tells me that he is 100% service-connected veteran.  He was transferred  to our facility for evaluation and stabilization.  The patient denied suicidal or homicidal ideation.  He denied auditory and visual hallucinations.  He stated that he had relapsed on alcohol, and understands the things that he needs to get back involved with.  Review of his laboratories on admission did show an MCV was decreased at 76, and MCH that was decreased to 25.2.  His liver function enzymes were significantly elevated.  His AST was 254, and his ALT was 77.  His blood alcohol on admission was 254.  Drug screen was positive for marijuana.  He was transferred to our facility for evaluation and stabilization.   Associated Signs/Symptoms: Depression Symptoms:  depressed mood, anhedonia, insomnia, psychomotor agitation, fatigue, feelings of worthlessness/guilt, difficulty concentrating, hopelessness, suicidal thoughts without plan, anxiety, disturbed sleep, Duration of Depression Symptoms: No data recorded (Hypo) Manic Symptoms:  Hallucinations, Impulsivity, Irritable Mood, Labiality of Mood, Anxiety Symptoms:  Excessive Worry, Psychotic Symptoms:  Delusions, Hallucinations: Auditory Paranoia, Duration of Psychotic Symptoms: Greater than six months  PTSD Symptoms: Had a traumatic exposure:  In the military Total Time spent with patient: 45 minutes  Past Psychiatric History: Patient has been involved with the veterans administrative psychiatric services since his discharge from the [employer].  His most recent exposure to their systems is as an outpatient, but also has been at the veterans administration hospital facility in [place], [place] for detox as well as substance abuse residential treatment.  Is the patient at risk to self? Yes.    Has the patient been a risk to self in the past 6 months? Yes.  Has the patient been a risk to self within the distant past? Yes.    Is the patient a risk to others? No.  Has the patient been a risk to others in the past 6 months? No.  Has the  patient been a risk to others within the distant past? No.   Prior Inpatient Therapy:   Prior Outpatient Therapy:    Alcohol Screening: 1. How often do you have a drink containing alcohol?: Monthly or less 2. How many drinks containing alcohol do you have on a typical day when you are drinking?: 1 or 2 3. How often do you have six or more drinks on one occasion?: Never AUDIT-C Score: 1 4. How often during the last year have you found that you were not able to stop drinking once you had started?: Never 5. How often during the last year have you failed to do what was normally expected from you because of drinking?: Never 6. How often during the last year have you needed a first drink in the morning to get yourself going after a heavy drinking session?: Never 7. How often during the last year have you had a feeling of guilt of remorse after drinking?: Never 8. How often during the last year have you been unable to remember what happened the night before because you had been drinking?: Never 9. Have you or someone else been injured as a result of your drinking?: No 10. Has a relative or friend or a doctor or another health worker been concerned about your drinking or suggested you cut down?: No Alcohol Use Disorder Identification Test Final Score (AUDIT): 1 Substance Abuse History in the last 12 months:  Yes.   Consequences of Substance Abuse: Medical Consequences:  Clearly his relapse on substances attributed to this medical and psychiatric hospitalization Withdrawal Symptoms:   Nausea Tremors Previous Psychotropic Medications: Yes  Psychological Evaluations: Yes  Past Medical History:  Past Medical History:  Diagnosis Date  . Hypertension   . PTSD (post-traumatic stress disorder)   . Seizures (HCC)    History reviewed. No pertinent surgical history. Family History: History reviewed. No pertinent family history. Family Psychiatric  History: Noncontributory Tobacco Screening:   Social  History:  Social History   Substance and Sexual Activity  Alcohol Use Yes     Social History   Substance and Sexual Activity  Drug Use Not on file    Additional Social History:                           Allergies:   Allergies  Allergen Reactions  . Lisinopril Swelling   Lab Results:  Results for orders placed or performed during the hospital encounter of 11/25/19 (from the past 48 hour(s))  Hemoglobin A1c     Status: None   Collection Time: 11/26/19  8:32 AM  Result Value Ref Range   Hgb A1c MFr Bld 5.5 4.8 - 5.6 %    Comment: (NOTE) Pre diabetes:          5.7%-6.4%  Diabetes:              >6.4%  Glycemic control for   <7.0% adults with diabetes    Mean Plasma Glucose 111.15 mg/dL    Comment: Performed at [facility], [address], [place], [place] [address]  Lipid panel     Status: Abnormal   Collection Time: 11/26/19  8:32 AM  Result Value Ref Range  Cholesterol 169 0 - 200 mg/dL   Triglycerides 161207 (H) <150 mg/dL   HDL 45 >09>40 mg/dL   Total CHOL/HDL Ratio 3.8 RATIO   VLDL 41 (H) 0 - 40 mg/dL   LDL Cholesterol 83 0 - 99 mg/dL    Comment:        Total Cholesterol/HDL:CHD Risk Coronary Heart Disease Risk Table                     Men   Women  1/2 Average Risk   3.4   3.3  Average Risk       5.0   4.4  2 X Average Risk   9.6   7.1  3 X Average Risk  23.4   11.0        Use the calculated Patient Ratio above and the CHD Risk Table to determine the patient's CHD Risk.        ATP III CLASSIFICATION (LDL):  <100     mg/dL   Optimal  [id number]  mg/dL   Near or Above                    Optimal  130-159  mg/dL   Borderline  [id number]  mg/dL   High  >478>190     mg/dL   Very High Performed at [facility], [address] [place], [place] [address]   TSH     Status: None   Collection Time: 11/26/19  8:32 AM  Result Value Ref Range   TSH 1.859 0.350 - 4.500 uIU/mL    Comment: Performed by a 3rd Generation assay with a  functional sensitivity of <=0.01 uIU/mL. Performed at [facility], [address] [place], [place] [address]   Comprehensive metabolic panel     Status: Abnormal   Collection Time: 11/26/19  8:32 AM  Result Value Ref Range   Sodium 141 135 - 145 mmol/L   Potassium 3.7 3.5 - 5.1 mmol/L   Chloride 101 98 - 111 mmol/L   CO2 28 22 - 32 mmol/L   Glucose, Bld 96 70 - 99 mg/dL    Comment: Glucose reference range applies only to samples taken after fasting for at least 8 hours.   BUN 11 6 - 20 mg/dL   Creatinine, Ser 5.781.04 0.61 - 1.24 mg/dL   Calcium 9.5 8.9 - 46.910.3 mg/dL   Total Protein 7.8 6.5 - 8.1 g/dL   Albumin 4.5 3.5 - 5.0 g/dL   AST 629159 (H) 15 - 41 U/L   ALT 77 (H) 0 - 44 U/L   Alkaline Phosphatase 72 38 - 126 U/L   Total Bilirubin 1.6 (H) 0.3 - 1.2 mg/dL   GFR, Estimated >52>60 >84>60 mL/min    Comment: (NOTE) Calculated using the CKD-EPI Creatinine Equation (2021)    Anion gap 12 5 - 15    Comment: Performed at [facility], [address] [place], [place] [address]    Blood Alcohol level:  Lab Results  Component Value Date   ETH 254 (H) 11/23/2019    Metabolic Disorder Labs:  Lab Results  Component Value Date   HGBA1C 5.5 11/26/2019   MPG 111.15 11/26/2019   No results found for: PROLACTIN Lab Results  Component Value Date   CHOL 169 11/26/2019   TRIG 207 (H) 11/26/2019   HDL 45 11/26/2019   CHOLHDL 3.8 11/26/2019   VLDL 41 (H) 11/26/2019   LDLCALC 83 11/26/2019  Current Medications: Current Facility-Administered Medications  Medication Dose Route Frequency Provider Last Rate Last Admin  . acetaminophen (TYLENOL) tablet 650 mg  650 mg Oral Q6H PRN Antonieta Pert, MD      . alum & mag hydroxide-simeth (MAALOX/MYLANTA) 200-200-20 MG/5ML suspension 30 mL  30 mL Oral Q4H PRN Antonieta Pert, MD      . carvedilol (COREG) tablet 25 mg  25 mg Oral BID WC Antonieta Pert, MD      . cyclobenzaprine (FLEXERIL) tablet 10  mg  10 mg Oral TID PRN Antonieta Pert, MD      . folic acid (FOLVITE) tablet 1 mg  1 mg Oral Daily Antonieta Pert, MD   1 mg at 11/26/19 1517  . hydrOXYzine (ATARAX/VISTARIL) tablet 25 mg  25 mg Oral TID PRN Antonieta Pert, MD   25 mg at 11/25/19 2119  . LORazepam (ATIVAN) tablet 1 mg  1 mg Oral Q6H PRN Antonieta Pert, MD      . risperiDONE (RISPERDAL M-TABS) disintegrating tablet 2 mg  2 mg Oral Q8H PRN Antonieta Pert, MD       And  . LORazepam (ATIVAN) tablet 1 mg  1 mg Oral PRN Antonieta Pert, MD       And  . ziprasidone (GEODON) injection 20 mg  20 mg Intramuscular PRN Antonieta Pert, MD      . magnesium hydroxide (MILK OF MAGNESIA) suspension 30 mL  30 mL Oral Daily PRN Antonieta Pert, MD      . naltrexone (DEPADE) tablet 50 mg  50 mg Oral Daily Antonieta Pert, MD   50 mg at 11/26/19 0906  . pantoprazole (PROTONIX) EC tablet 40 mg  40 mg Oral Daily Antonieta Pert, MD   40 mg at 11/26/19 0907  . potassium chloride SA (KLOR-CON) CR tablet 20 mEq  20 mEq Oral BID Antonieta Pert, MD   20 mEq at 11/26/19 1112  . prazosin (MINIPRESS) capsule 5 mg  5 mg Oral QHS Antonieta Pert, MD   5 mg at 11/25/19 2118  . rosuvastatin (CRESTOR) tablet 5 mg  5 mg Oral Daily Antonieta Pert, MD   5 mg at 11/26/19 0906  . sertraline (ZOLOFT) tablet 100 mg  100 mg Oral Daily Antonieta Pert, MD   100 mg at 11/26/19 0908  . thiamine tablet 100 mg  100 mg Oral Daily Antonieta Pert, MD   100 mg at 11/26/19 0906  . traZODone (DESYREL) tablet 100 mg  100 mg Oral QHS PRN Antonieta Pert, MD      . traZODone (DESYREL) tablet 50 mg  50 mg Oral QHS PRN Antonieta Pert, MD   50 mg at 11/25/19 2119   PTA Medications: Medications Prior to Admission  Medication Sig Dispense Refill Last Dose  . amitriptyline (ELAVIL) 25 MG tablet Take 25 mg by mouth See admin instructions. Take 1/2 tablet by mouth twice a day at 7am and 9 pm     . amLODipine (NORVASC) 10 MG tablet  Take by mouth.     . carvedilol (COREG) 12.5 MG tablet Take 12.5 mg by mouth 2 (two) times daily with a meal.     . cyclobenzaprine (FLEXERIL) 10 MG tablet Take 10 mg by mouth 3 (three) times daily as needed for muscle spasms.     . folic acid (FOLVITE) 1 MG tablet Take by mouth.     . naltrexone (DEPADE)  50 MG tablet Take 50 mg by mouth daily.     Marland Kitchen omeprazole (PRILOSEC) 20 MG capsule Take 20 mg by mouth daily.     . prazosin (MINIPRESS) 5 MG capsule Take by mouth.     . rosuvastatin (CRESTOR) 5 MG tablet Take by mouth.     . sertraline (ZOLOFT) 100 MG tablet Take by mouth.     . traZODone (DESYREL) 100 MG tablet Take 100 mg by mouth at bedtime.       Musculoskeletal: Strength & Muscle Tone: within normal limits Gait & Station: normal Patient leans: N/A  Psychiatric Specialty Exam: Physical Exam Vitals and nursing note reviewed.  Constitutional:      Appearance: Normal appearance.  HENT:     Head: Normocephalic and atraumatic.  Pulmonary:     Effort: Pulmonary effort is normal.  Neurological:     General: No focal deficit present.     Mental Status: He is alert and oriented to person, place, and time.     Review of Systems  Blood pressure (!) 157/116, pulse 95, temperature 98.7 F (37.1 C), temperature source Oral, resp. rate 18, height 5\' 10"  (1.778 m), weight 113.4 kg, SpO2 98 %.Body mass index is 35.87 kg/m.  General Appearance: Casual  Eye Contact:  Good  Speech:  Normal Rate  Volume:  Normal  Mood:  Anxious  Affect:  Congruent  Thought Process:  Coherent and Descriptions of Associations: Intact  Orientation:  Full (Time, Place, and Person)  Thought Content:  Logical  Suicidal Thoughts:  No  Homicidal Thoughts:  No  Memory:  Immediate;   Fair Recent;   Fair Remote;   Fair  Judgement:  Intact  Insight:  Fair  Psychomotor Activity:  Normal  Concentration:  Concentration: Fair  Recall:  of Knowledge:  Fair  Language:  Good  Akathisia:  Negative   Handed:  Right  AIMS (if indicated):     Assets:  Desire for Improvement Resilience  ADL's:  Intact  Cognition:  WNL  Sleep:  Number of Hours: 6.25    Treatment Plan Summary: Daily contact with patient to assess and evaluate symptoms and progress in treatment, Medication management and Plan : Patient is seen and examined.  Patient is a 44 year old male with the above-stated past psychiatric history who was transferred to our facility for evaluation and stabilization.  He will be admitted to the hospital.  He will be integrated in the milieu.  He will be encouraged to attend groups.  We will continue his medications as previously written.  This includes oral naltrexone, prazosin, folic acid, carvedilol, Crestor, sertraline, thiamine and trazodone.  The patient stated he is no longer taking lisinopril, amlodipine or amitriptyline.  These will all be stopped.  We will adjust his blood pressure during the course of hospitalization.  I have also put on board prazosin 5 mg p.o. nightly for nightmares and flashbacks of his previous trauma history.  He will also have available Flexeril for muscle spasms.  Because of the elevation in his liver function enzymes we will stop the Tylenol.  If he requires anything for pain we will give him ibuprofen as needed.  Because of his alcohol issues we will have available lorazepam 1 mg p.o. every 6 hours as needed a CIWA greater than 10.  Because of the potential for agitation agitation protocol with Risperdal has been put in place.  Review of his admission laboratories revealed a low potassium at 2.9.  This will  be supplemented.  Liver function abnormalities as previously noted.  Unfortunately we do not have access to any other liver function enzymes to compare this to.  We will write for repeat liver function enzymes tomorrow to make sure that they are going down.  His white blood cell count was mildly elevated to 10.6.  MCV and MCH as previously noted.  His platelets were  low normal at 229,000.  Differential was normal.  Drug screen was positive for marijuana, blood alcohol as noted above was 254.  His CT scan of the chest to rule out pulmonary emboli was negative.  His EKG showed a sinus tachycardia with an elevated QTc interval at 500.  We will repeat this after his potassium has been repleted.  Magnesium was not ordered, but I will take care of that today.  We will supplement that if necessary.  His blood pressure is elevated at 158/123 and 148/113.  His heart rate was between 78 and 97.  In the short run I will go on and increase his carvedilol to 25 mg p.o. twice daily.  Observation Level/Precautions:  Detox 15 minute checks  Laboratory:  Chemistry Profile  Psychotherapy:    Medications:    Consultations:    Discharge Concerns:    Estimated LOS:  Other:     Physician Treatment Plan for Primary Diagnosis: <principal problem not specified> Long Term Goal(s): Improvement in symptoms so as ready for discharge  Short Term Goals: Ability to identify changes in lifestyle to reduce recurrence of condition will improve, Ability to verbalize feelings will improve, Ability to disclose and discuss suicidal ideas, Ability to demonstrate self-control will improve, Ability to identify and develop effective coping behaviors will improve, Ability to maintain clinical measurements within normal limits will improve, Compliance with prescribed medications will improve and Ability to identify triggers associated with substance abuse/mental health issues will improve  Physician Treatment Plan for Secondary Diagnosis: Active Problems:   Alcohol use disorder, severe, dependence (HCC)  Long Term Goal(s): Improvement in symptoms so as ready for discharge  Short Term Goals: Ability to identify changes in lifestyle to reduce recurrence of condition will improve, Ability to verbalize feelings will improve, Ability to disclose and discuss suicidal ideas, Ability to demonstrate  self-control will improve, Ability to identify and develop effective coping behaviors will improve, Ability to maintain clinical measurements within normal limits will improve, Compliance with prescribed medications will improve and Ability to identify triggers associated with substance abuse/mental health issues will improve  I certify that inpatient services furnished can reasonably be expected to improve the patient's condition.    Antonieta Pert, MD 11/6/202112:30 PM

## 2019-11-26 NOTE — Progress Notes (Signed)
   11/25/19 1945  COVID-19 Daily Checkoff  Have you had a fever (temp > 37.80C/100F)  in the past 24 hours?  No  COVID-19 EXPOSURE  Have you traveled outside the state in the past 14 days? No  Have you been in contact with someone with a confirmed diagnosis of COVID-19 or PUI in the past 14 days without wearing appropriate PPE? No  Have you been living in the same home as a person with confirmed diagnosis of COVID-19 or a PUI (household contact)? No  Have you been diagnosed with COVID-19? No
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
## 2019-11-26 NOTE — [facility] Suicide Risk Assessment (Signed)
[facility] Admission Suicide Risk Assessment   Nursing information obtained from:  Patient Demographic factors:  Male Current Mental Status:  Suicide plan Loss Factors:  Decrease in vocational status, Legal issues Historical Factors:  Impulsivity Risk Reduction Factors:  Sense of responsibility to family  Total Time spent with patient: 30 minutes Principal Problem: <principal problem not specified> Diagnosis:  Active Problems:   Alcohol use disorder, severe, dependence (HCC)  Subjective Data: Patient is seen and examined.  Patient is a 44 year old male with a past psychiatric history significant for posttraumatic stress disorder, depression and alcohol use disorder who originally presented to the [facility] emergency department on 11/23/2019 with suicidal ideation.  The patient stated that he had had suicidal ideation over the weekend.  He stated that he had had unspecified triggers that made him think about his previous trauma.  He stated that he had had several tours in [place] as well as [place], and saw multiple dead bodies.  He started thinking about that again and relapsed on alcohol.  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  He stated that he had been at the [facility] in [place] once in 2020 and once in 2021 for alcohol related issues.  He apparently was there for at least 30 days.  He mentions something about schizophrenia in the clinical comprehensive evaluation note, but review of the electronic medical record did not reveal any previous history of schizophrenia.  He apparently had developed some chest pain, and was kept in the emergency department.  His cardiac enzymes were negative.  CT scan of the chest did not reveal any obvious pulmonary emboli.  He was felt to be cleared psychiatrically for transfer.  Request were sent to the [facility], but apparently no acute beds were available.  Request were also sent to the [facility] but apparently they also were on diversion.  The patient tells me that he is 100% service-connected veteran.  He was transferred to our facility for evaluation and stabilization.  The patient denied suicidal or homicidal ideation.  He denied auditory and visual hallucinations.  He stated that he had relapsed on alcohol, and understands the things that he needs to get back involved with.  Review of his laboratories on admission did show an MCV was decreased at 76, and MCH that was decreased to 25.2.  His liver function enzymes were significantly elevated.  His AST was 254, and his ALT was 77.  His blood alcohol on admission was 254.  Drug screen was positive for marijuana.  He was transferred to our facility for evaluation and stabilization.  Continued Clinical Symptoms:  Alcohol Use Disorder Identification Test Final Score (AUDIT): 1 The "Alcohol Use Disorders Identification Test", Guidelines for Use in Primary Care, Second Edition.  World Science writer [facility]). Score between 0-7:  no or low risk or alcohol related problems. Score between 8-15:  moderate risk of alcohol related problems. Score between 16-19:  high risk of alcohol related problems. Score 20 or above:  warrants further diagnostic evaluation for alcohol dependence and treatment.   CLINICAL FACTORS:   Depression:   Anhedonia Comorbid alcohol abuse/dependence Hopelessness Impulsivity Insomnia Alcohol/Substance Abuse/Dependencies   Musculoskeletal: Strength & Muscle Tone: within normal limits Gait & Station: normal Patient leans: N/A  Psychiatric Specialty Exam: Physical Exam Vitals and nursing note reviewed.  Constitutional:      Appearance: Normal appearance.  HENT:     Head:  Normocephalic and atraumatic.  Pulmonary:     Effort: Pulmonary effort is normal.  Neurological:     General: No focal deficit present.     Mental Status: He is alert and oriented to person, place, and time.     Review of Systems   Blood pressure (!) 148/113, pulse 97, temperature 98.7 F (37.1 C), temperature source Oral, resp. rate 18, height 5\' 10"  (1.778 m), weight 113.4 kg, SpO2 98 %.Body mass index is 35.87 kg/m.  General Appearance: Casual  Eye Contact:  Good  Speech:  Normal Rate  Volume:  Normal  Mood:  Anxious  Affect:  Congruent  Thought Process:  Coherent and Descriptions of Associations: Intact  Orientation:  Full (Time, Place, and Person)  Thought Content:  Logical  Suicidal Thoughts:  No  Homicidal Thoughts:  No  Memory:  Immediate;   Fair Recent;   Fair Remote;   Fair  Judgement:  Intact  Insight:  Fair  Psychomotor Activity:  Normal  Concentration:  Concentration: Fair and Attention Span: Fair  Recall:  of Knowledge:  Fair  Language:  Good  Akathisia:  Negative  Handed:  Right  AIMS (if indicated):     Assets:  Desire for Improvement Housing Resilience  ADL's:  Intact  Cognition:  WNL  Sleep:  Number of Hours: 6.25      COGNITIVE FEATURES THAT CONTRIBUTE TO RISK:  None    SUICIDE RISK:   Minimal: No identifiable suicidal ideation.  Patients presenting with no risk factors but with morbid ruminations; may be classified as minimal risk based on the severity of the depressive symptoms  PLAN OF CARE: Patient is seen and examined.  Patient is a 44 year old male with the above-stated past psychiatric history who was transferred to our facility for evaluation and stabilization.  He will be admitted to the hospital.  He will be integrated in the milieu.  He will be encouraged to attend groups.  We will continue his medications as previously written.  This includes oral naltrexone, prazosin, folic acid, carvedilol, Crestor, sertraline, thiamine and trazodone.  The patient stated he is no longer taking lisinopril, amlodipine or amitriptyline.  These will all be stopped.  We will adjust his blood pressure during the course of hospitalization.  I have also put on board prazosin 5 mg  p.o. nightly for nightmares and flashbacks of his previous trauma history.  He will also have available Flexeril for muscle spasms.  Because of the elevation in his liver function enzymes we will stop the Tylenol.  If he requires anything for pain we will give him ibuprofen as needed.  Because of his alcohol issues we will have available lorazepam 1 mg p.o. every 6 hours as needed a CIWA greater than 10.  Because of the potential for agitation agitation protocol with Risperdal has been put in place.  Review of his admission laboratories revealed a low potassium at 2.9.  This will be supplemented.  Liver function abnormalities as previously noted.  Unfortunately we do not have access to any other liver function enzymes to compare this to.  We will write for repeat liver function enzymes tomorrow to make sure that they are going down.  His white blood cell count was mildly elevated to 10.6.  MCV and MCH as previously noted.  His platelets were low normal at 229,000.  Differential was normal.  Drug screen was positive for marijuana, blood alcohol as noted above was 254.  His CT scan of the  chest to rule out pulmonary emboli was negative.  His EKG showed a sinus tachycardia with an elevated QTc interval at 500.  We will repeat this after his potassium has been repleted.  Magnesium was not ordered, but I will take care of that today.  We will supplement that if necessary.  His blood pressure is elevated at 158/123 and 148/113.  His heart rate was between 78 and 97.  In the short run I will go on and increase his carvedilol to 25 mg p.o. twice daily.  I certify that inpatient services furnished can reasonably be expected to improve the patient's condition.   Antonieta Pert, MD 11/26/2019, 9:59 AM

## 2019-11-26 NOTE — Progress Notes (Signed)
Patient rates depression 3/10, anxiety 4/10, and hopelessness 3/10 (10 being worst). He denies SI/HI. He denies AVH. He reports fair sleep at night. He reports having a good appetite. He refused to take Elavil this morning, stating that it caused his heart to beat fast so his doctor took him off of it. He has been hypertensive today and Dr. Jola Babinski was made aware. Patient treated for hypertension. Will continue to monitor b/p.   Orders reviewed. Vital signs reviewed. Verbal support provided. 15 minute checks performed for safety.   Patient compliant with taking medications and attends scheduled groups.

## 2019-11-27 DIAGNOSIS — F10188 Alcohol abuse with other alcohol-induced disorder: Secondary | ICD-10-CM

## 2019-11-27 MED ORDER — CLONIDINE HCL 0.1 MG PO TABS
0.1000 mg | ORAL_TABLET | Freq: Two times a day (BID) | ORAL | Status: DC
  Administered 2019-11-27 – 2019-11-28 (×3): 0.1 mg via ORAL
  Filled 2019-11-27 (×7): qty 1

## 2019-11-27 MED ORDER — NICOTINE 21 MG/24HR TD PT24
21.0000 mg | MEDICATED_PATCH | Freq: Every day | TRANSDERMAL | Status: DC
  Administered 2019-11-27 – 2019-11-28 (×2): 21 mg via TRANSDERMAL
  Filled 2019-11-27 (×4): qty 1

## 2019-11-27 NOTE — [facility] Group Notes (Signed)
Adult Psychoeducational Group Not Date:  11/27/2019 Time:  0900-1045 Group Topic/Focus: PROGRESSIVE RELAXATION. A group where deep breathing is taught and tensing and relaxation muscle groups is used. Imagery is used as well.  Pts are asked to imagine 3 pillars that hold them up when they are not able to hold themselves up.  Participation Level:  Active  Participation Quality:  Appropriate  Affect:  Appropriate  Cognitive:  Oriented  Insight: Improving  Engagement in Group:  Engaged  Modes of Intervention:  Activity, Discussion, Education, and Support  Additional Comments:  Children,, his father and and his soulmate  Dione Housekeeper 11/27/2019`

## 2019-11-27 NOTE — Progress Notes (Signed)
   11/26/19 2010  Psych Admission Type (Psych Patients Only)  Admission Status Involuntary  Psychosocial Assessment  Patient Complaints Depression  Eye Contact Fair  Facial Expression Anxious  Affect Appropriate to circumstance  Speech Logical/coherent  Interaction Assertive  Motor Activity Slow  Appearance/Hygiene In scrubs  Behavior Characteristics Cooperative;Appropriate to situation  Mood Pleasant;Anxious  Aggressive Behavior  Effect No apparent injury  Thought Process  Coherency WDL  Content WDL  Delusions None reported or observed  Perception WDL  Hallucination None reported or observed  Judgment WDL  Confusion None  Danger to Self  Current suicidal ideation? Denies  Danger to Others  Danger to Others None reported or observed

## 2019-11-27 NOTE — Progress Notes (Signed)
   11/26/19 2010  COVID-19 Daily Checkoff  Have you had a fever (temp > 37.80C/100F)  in the past 24 hours?  No  If you have had runny nose, nasal congestion, sneezing in the past 24 hours, has it worsened? No  COVID-19 EXPOSURE  Have you traveled outside the state in the past 14 days? No  Have you been in contact with someone with a confirmed diagnosis of COVID-19 or PUI in the past 14 days without wearing appropriate PPE? No  Have you been living in the same home as a person with confirmed diagnosis of COVID-19 or a PUI (household contact)? No  Have you been diagnosed with COVID-19? No

## 2019-11-27 NOTE — [facility] Group Notes (Signed)
Adult Psychoeducational Group Note  Date:  11/27/2019 Time:  9:12 PM  Group Topic/Focus:  Wrap-Up Group:   The focus of this group is to help patients review their daily goal of treatment and discuss progress on daily workbooks.  Participation Level:  Active  Participation Quality:  Appropriate and Attentive  Affect:  Appropriate  Cognitive:  Alert and Appropriate  Insight: Appropriate and Good  Engagement in Group:  Engaged  Modes of Intervention:  Discussion and Education  Additional Comments:  Pt attended and participated in wrap up group this evening and rated their day a 7/10, due to them staying positive. Pt stays positive by being around positive people, giving back to the community and going to their "step program". Pt goal is to keep a sane mind and spirit, stay sober and being more compassionate towards others.   Chrisandra Netters 11/27/2019, 9:12 PM

## 2019-11-27 NOTE — Progress Notes (Signed)
Patient rates depression 0, anxiety 0, hopelessness 0. He denies SI/HI. He denies AVH. He reports fair sleep at night. He reports having a fair appetite. He denies withdrawal symptoms. Patient hypertensive, Dr. Jola Babinski made aware. Patient b/p treated and will continue to monitor. Patient denies any concerns.  Orders reviewed. Vital signs assessed. Verbal support provided. 15 minute checks performed for safety.   Patient compliant with taking medications and denies any medications side effects. Patient compliant with attending scheduled groups.

## 2019-11-27 NOTE — Progress Notes (Signed)
   11/27/19 2140  COVID-19 Daily Checkoff  Have you had a fever (temp > 37.80C/100F)  in the past 24 hours?  No  If you have had runny nose, nasal congestion, sneezing in the past 24 hours, has it worsened? No  COVID-19 EXPOSURE  Have you traveled outside the state in the past 14 days? No  Have you been in contact with someone with a confirmed diagnosis of COVID-19 or PUI in the past 14 days without wearing appropriate PPE? No  Have you been living in the same home as a person with confirmed diagnosis of COVID-19 or a PUI (household contact)? No  Have you been diagnosed with COVID-19? No

## 2019-11-27 NOTE — [facility] Suicide Risk Assessment (Addendum)
[facility] INPATIENT:  Family/Significant Other Suicide Prevention Education  Suicide Prevention Education:  Contact Attempts: girlfriend Coy Saunas at [phone], (name of family member/significant other) has been identified by the patient as the family member/significant other with whom the patient will be residing, and identified as the person(s) who will aid the patient in the event of a mental health crisis.  With written consent from the patient, two attempts were made to provide suicide prevention education, prior to and/or following the patient's discharge.  We were unsuccessful in providing suicide prevention education.  A suicide education pamphlet was given to the patient to share with family/significant other.  Date and time of first attempt:  11/27/2019  /  3:10pm Date and time of second attempt:  11/28/2019 at 9:15am  Unable to reach supports, SPE pamphlet placed on chart for patient to share with supports at discharge.   Carloyn Jaeger Grossman-Orr 11/27/2019, 3:14 PM   Fredirick Lathe, LCSWA Clinicial Social Worker [employer] [facility]

## 2019-11-27 NOTE — Progress Notes (Addendum)
[facility] MD Progress Note  11/27/2019 2:33 PM Jonathan Gomez  MRN:  [id number]   Subjective: Jonathan Gomez reports " I just needed to reset."  Evaluation: Patient observed sitting in dayroom interacting with peers.  He is denying suicidal or homicidal ideations.  Denies auditory or visual hallucinations.  Reports he has been nonmedication compliant.  States " I do not take my medications all the time like I should."  Reports he is followed by the [place]. reports plans to continue medications as directed and will consider meeting with therapist via Zoom.  States "my flashbacks from being in the [employer] become overwhelming".  Denies depression or depressive symptoms. Jonathan Gomez reports resting 5 to 6 hours nightly.  States a good appetite.  Denied alcohol cravings or withdrawal symptoms. Reported Depade 50mg  has been helpful. Support, encouragement and reassurance was provided.  Principal Problem: Alcohol use disorder, severe, dependence (HCC) Diagnosis: Principal Problem:   Alcohol use disorder, severe, dependence (HCC)  Total Time spent with patient: 15 minutes  Past Psychiatric History:   Past Medical History:  Past Medical History:  Diagnosis Date  . Hypertension   . PTSD (post-traumatic stress disorder)   . Seizures (HCC)    History reviewed. No pertinent surgical history. Family History: History reviewed. No pertinent family history. Family Psychiatric  History:  Social History:  Social History   Substance and Sexual Activity  Alcohol Use Yes     Social History   Substance and Sexual Activity  Drug Use Not on file    Social History   Socioeconomic History  . Marital status: Single    Spouse name: Not on file  . Number of children: Not on file  . Years of education: Not on file  . Highest education level: Not on file  Occupational History  . Not on file  Tobacco Use  . Smoking status: Current Every Day Smoker    Packs/day: 1.00    Types: Cigarettes  . Smokeless tobacco: Never  Used  Substance and Sexual Activity  . Alcohol use: Yes  . Drug use: Not on file  . Sexual activity: Not on file  Other Topics Concern  . Not on file  Social History Narrative  . Not on file   Social Determinants of Health   Financial Resource Strain:   . Difficulty of Paying Living Expenses: Not on file  Food Insecurity:   . Worried About in the Last Year: Not on file  . Ran Out of Food in the Last Year: Not on file  Transportation Needs:   . Lack of Transportation (Medical): Not on file  . Lack of Transportation (Non-Medical): Not on file  Physical Activity:   . Days of Exercise per Week: Not on file  . Minutes of Exercise per Session: Not on file  Stress:   . Feeling of Stress : Not on file  Social Connections:   . Frequency of Communication with Friends and Family: Not on file  . Frequency of Social Gatherings with Friends and Family: Not on file  . Attends Religious Services: Not on file  . Active Member of Clubs or Organizations: Not on file  . Attends Programme researcher, broadcasting/film/video Meetings: Not on file  . Marital Status: Not on file   Additional Social History:                         Sleep: Fair  Appetite:  Fair  Current Medications: Current Facility-Administered Medications  Medication Dose Route Frequency Provider Last Rate Last Admin  . acetaminophen (TYLENOL) tablet 650 mg  650 mg Oral Q6H PRN Antonieta Pert, MD      . alum & mag hydroxide-simeth (MAALOX/MYLANTA) 200-200-20 MG/5ML suspension 30 mL  30 mL Oral Q4H PRN Antonieta Pert, MD      . carvedilol (COREG) tablet 25 mg  25 mg Oral BID WC Antonieta Pert, MD   25 mg at 11/27/19 0756  . cloNIDine (CATAPRES) tablet 0.1 mg  0.1 mg Oral BID Antonieta Pert, MD   0.1 mg at 11/27/19 0756  . cyclobenzaprine (FLEXERIL) tablet 10 mg  10 mg Oral TID PRN Antonieta Pert, MD      . folic acid (FOLVITE) tablet 1 mg  1 mg Oral Daily Antonieta Pert, MD   1 mg at 11/27/19 0756   . hydrOXYzine (ATARAX/VISTARIL) tablet 25 mg  25 mg Oral TID PRN Antonieta Pert, MD   25 mg at 11/26/19 2117  . LORazepam (ATIVAN) tablet 1 mg  1 mg Oral Q6H PRN Antonieta Pert, MD      . risperiDONE (RISPERDAL M-TABS) disintegrating tablet 2 mg  2 mg Oral Q8H PRN Antonieta Pert, MD       And  . LORazepam (ATIVAN) tablet 1 mg  1 mg Oral PRN Antonieta Pert, MD       And  . ziprasidone (GEODON) injection 20 mg  20 mg Intramuscular PRN Antonieta Pert, MD      . magnesium hydroxide (MILK OF MAGNESIA) suspension 30 mL  30 mL Oral Daily PRN Antonieta Pert, MD      . naltrexone (DEPADE) tablet 50 mg  50 mg Oral Daily Antonieta Pert, MD   50 mg at 11/27/19 0756  . nicotine (NICODERM CQ - dosed in mg/24 hours) patch 21 mg  21 mg Transdermal Daily Antonieta Pert, MD   21 mg at 11/27/19 0804  . pantoprazole (PROTONIX) EC tablet 40 mg  40 mg Oral Daily Antonieta Pert, MD   40 mg at 11/27/19 0756  . prazosin (MINIPRESS) capsule 5 mg  5 mg Oral QHS Antonieta Pert, MD   5 mg at 11/26/19 2117  . rosuvastatin (CRESTOR) tablet 5 mg  5 mg Oral Daily Antonieta Pert, MD   5 mg at 11/27/19 0756  . sertraline (ZOLOFT) tablet 100 mg  100 mg Oral Daily Antonieta Pert, MD   100 mg at 11/27/19 0756  . thiamine tablet 100 mg  100 mg Oral Daily Antonieta Pert, MD   100 mg at 11/27/19 0756  . traZODone (DESYREL) tablet 100 mg  100 mg Oral QHS PRN Antonieta Pert, MD   100 mg at 11/26/19 2116  . traZODone (DESYREL) tablet 50 mg  50 mg Oral QHS PRN Antonieta Pert, MD   50 mg at 11/25/19 2119    Lab Results:  Results for orders placed or performed during the hospital encounter of 11/25/19 (from the past 48 hour(s))  Hemoglobin A1c     Status: None   Collection Time: 11/26/19  8:32 AM  Result Value Ref Range   Hgb A1c MFr Bld 5.5 4.8 - 5.6 %    Comment: (NOTE) Pre diabetes:          5.7%-6.4%  Diabetes:              >6.4%  Glycemic control for   <7.0% adults  with diabetes    Mean Plasma Glucose 111.15 mg/dL    Comment: Performed at [facility], [address] [place], [place] [address]  Lipid panel     Status: Abnormal   Collection Time: 11/26/19  8:32 AM  Result Value Ref Range   Cholesterol 169 0 - 200 mg/dL   Triglycerides 604207 (H) <150 mg/dL   HDL 45 >54>40 mg/dL   Total CHOL/HDL Ratio 3.8 RATIO   VLDL 41 (H) 0 - 40 mg/dL   LDL Cholesterol 83 0 - 99 mg/dL    Comment:        Total Cholesterol/HDL:CHD Risk Coronary Heart Disease Risk Table                     Men   Women  1/2 Average Risk   3.4   3.3  Average Risk       5.0   4.4  2 X Average Risk   9.6   7.1  3 X Average Risk  23.4   11.0        Use the calculated Patient Ratio above and the CHD Risk Table to determine the patient's CHD Risk.        ATP III CLASSIFICATION (LDL):  <100     mg/dL   Optimal  098-119100-129  mg/dL   Near or Above                    Optimal  130-159  mg/dL   Borderline  147-829160-189  mg/dL   High  >562>190     mg/dL   Very High Performed at [facility], [address] [place], [place] [address]   TSH     Status: None   Collection Time: 11/26/19  8:32 AM  Result Value Ref Range   TSH 1.859 0.350 - 4.500 uIU/mL    Comment: Performed by a 3rd Generation assay with a functional sensitivity of <=0.01 uIU/mL. Performed at [facility], [address] [place], [place] [address]   Comprehensive metabolic panel     Status: Abnormal   Collection Time: 11/26/19  8:32 AM  Result Value Ref Range   Sodium 141 135 - 145 mmol/L   Potassium 3.7 3.5 - 5.1 mmol/L   Chloride 101 98 - 111 mmol/L   CO2 28 22 - 32 mmol/L   Glucose, Bld 96 70 - 99 mg/dL    Comment: Glucose reference range applies only to samples taken after fasting for at least 8 hours.   BUN 11 6 - 20 mg/dL   Creatinine, Ser 9.621.04 0.61 - 1.24 mg/dL   Calcium 9.5 8.9 - 95.210.3 mg/dL   Total Protein 7.8 6.5 - 8.1 g/dL   Albumin 4.5 3.5 - 5.0 g/dL   AST 841159 (H) 15 -  41 U/L   ALT 77 (H) 0 - 44 U/L   Alkaline Phosphatase 72 38 - 126 U/L   Total Bilirubin 1.6 (H) 0.3 - 1.2 mg/dL   GFR, Estimated >32>60 >44>60 mL/min    Comment: (NOTE) Calculated using the CKD-EPI Creatinine Equation (2021)    Anion gap 12 5 - 15    Comment: Performed at [facility], [address] [place], [place] [address]    Blood Alcohol level:  Lab Results  Component Value Date   ETH 254 (H) 11/23/2019    Metabolic Disorder Labs: Lab Results  Component Value Date   HGBA1C 5.5 11/26/2019  MPG 111.15 11/26/2019   No results found for: PROLACTIN Lab Results  Component Value Date   CHOL 169 11/26/2019   TRIG 207 (H) 11/26/2019   HDL 45 11/26/2019   CHOLHDL 3.8 11/26/2019   VLDL 41 (H) 11/26/2019   LDLCALC 83 11/26/2019    Physical Findings: AIMS: Facial and Oral Movements Muscles of Facial Expression: None, normal Lips and Perioral Area: None, normal Jaw: None, normal Tongue: None, normal,Extremity Movements Upper (arms, wrists, hands, fingers): None, normal Lower (legs, knees, ankles, toes): None, normal, Trunk Movements Neck, shoulders, hips: None, normal, Overall Severity Severity of abnormal movements (highest score from questions above): None, normal Incapacitation due to abnormal movements: None, normal Patient's awareness of abnormal movements (rate only patient's report): No Awareness, Dental Status Current problems with teeth and/or dentures?: No Does patient usually wear dentures?: No  CIWA:  CIWA-Ar Total: 2 COWS:     Musculoskeletal: Strength & Muscle Tone: within normal limits Gait & Station: normal Patient leans: N/A  Psychiatric Specialty Exam: Physical Exam Nursing note reviewed.  Psychiatric:        Mood and Affect: Mood normal.     Review of Systems  Blood pressure (!) 143/102, pulse 78, temperature 98.8 F (37.1 C), temperature source Oral, resp. rate 18, height 5\' 10"  (1.778 m), weight 113.4 kg, SpO2 100 %.Body  mass index is 35.87 kg/m.  General Appearance: Casual  Eye Contact:  Good  Speech:  Clear and Coherent  Volume:  Normal  Mood:  Anxious and Depressed  Affect:  Congruent  Thought Process:  Coherent  Orientation:  Full (Time, Place, and Person)  Thought Content:  Logical  Suicidal Thoughts:  No  Homicidal Thoughts:  No  Memory:  Immediate;   Fair Recent;   Fair  Judgement:  Fair  Insight:  Good  Psychomotor Activity:  Normal  Concentration:  Concentration: Fair  Recall:  Good  Fund of Knowledge:  Good  Language:  Fair  Akathisia:  No  Handed:  Right  AIMS (if indicated):     Assets:  Communication Skills Desire for Improvement Resilience Social Support  ADL's:  Intact  Cognition:  WNL  Sleep:  Number of Hours: 7     Treatment Plan Summary: Daily contact with patient to assess and evaluate symptoms and progress in treatment and Medication management   Continue Zoloft 100 mg p.o. daily Continue Minipress 5 mg p.o. nightly Continue defecate 50 mg p.o. daily  Hypertension medication:  Continue Coreg 25 mg p.o. twice daily Continue clonidine 0.1 mg p.o. twice daily , NP 11/27/2019, 2:33 PM

## 2019-11-27 NOTE — [facility] Group Notes (Signed)
[facility] LCSW Group Therapy Note  11/27/2019    Type of Therapy and Topic:  Group Therapy:  Adding Supports Including Yourself  Participation Level:  Active   Description of Group:   Patients in this group were introduced to the concept that additional supports including self-support are an essential part of recovery.  Patients listed what supports they believe they need to add to their lives to achieve their goals at discharge, and they listed such things as therapist, family, doctor, support groups, 12-step groups and service animals.   A song entitled "My Own Hero" was played and a group discussion ensued in which patients stated they could relate to the song and it inspired them to realize they have be willing to help themselves in order to succeed, because other people cannot achieve sobriety or stability for them.  "Fight For It" was played, then "I Am Enough" to encourage patients.  They discussed the impact on them and how they must remain convinced that their lives are worth the effort it takes to become sober and/or stable.  Therapeutic Goals: 1)  demonstrate the importance of being a key part of one's own support system 2)  discuss various available supports 3)  encourage patient to use music as part of their self-support and focus on goals 4)  elicit ideas from patients about supports that need to be added   Summary of Patient Progress:  The patient expressed that social workers, counselors, his family, and peers who understand his illness(es) are healthy for him, while friends who are enablers while he has money and Engineer, site when he runs out of money are unhealthy.  He also said that he himself is usually unhealthy as a support.   Therapeutic Modalities:   Motivational Interviewing Activity  Lynnell Chad  2:31 PM

## 2019-11-28 DIAGNOSIS — F1020 Alcohol dependence, uncomplicated: Secondary | ICD-10-CM | POA: Diagnosis not present

## 2019-11-28 MED ORDER — PRAZOSIN HCL 5 MG PO CAPS: 5.0000 mg | ORAL_CAPSULE | Freq: Every day | ORAL | 0 refills | Status: AC

## 2019-11-28 MED ORDER — CARVEDILOL 25 MG PO TABS: 25.0000 mg | ORAL_TABLET | Freq: Two times a day (BID) | ORAL | 0 refills | Status: AC

## 2019-11-28 MED ORDER — ROSUVASTATIN CALCIUM 5 MG PO TABS: 5.0000 mg | ORAL_TABLET | Freq: Every day | ORAL | 0 refills | Status: AC

## 2019-11-28 MED ORDER — SERTRALINE HCL 100 MG PO TABS: 100.0000 mg | ORAL_TABLET | Freq: Every day | ORAL | 0 refills | Status: AC

## 2019-11-28 MED ORDER — CLONIDINE HCL 0.1 MG PO TABS: 0.1000 mg | ORAL_TABLET | Freq: Two times a day (BID) | ORAL | 11 refills | Status: AC

## 2019-11-28 NOTE — [facility] Group Notes (Signed)
[facility] LCSW Group Therapy  11/28/2019 2:11 PM  Type of Therapy:  Group Therapy  Participation Level:  Active  Participation Quality:  Appropriate  Affect:  Appropriate  Cognitive:  Oriented  Insight:  Engaged and Improving  Engagement in Therapy:  Engaged and Improving  Modes of Intervention:  Discussion and Education  Summary of Progress/Problems: Pt attended and participated in group about the phases of forgiveness. Pt states that he feels resentment towards himself.   Felizardo Hoffmann 11/28/2019, 2:11 PM

## 2019-11-28 NOTE — [facility] Suicide Risk Assessment (Signed)
[facility] Discharge Suicide Risk Assessment   Principal Problem: Alcohol use disorder, severe, dependence (HCC) Discharge Diagnoses: Principal Problem:   Alcohol use disorder, severe, dependence (HCC)   Total Time spent with patient: 20 minutes  Musculoskeletal: Strength & Muscle Tone: within normal limits Gait & Station: normal Patient leans: N/A  Psychiatric Specialty Exam: Review of Systems  All other systems reviewed and are negative.   Blood pressure (!) 114/93, pulse 96, temperature 97.7 F (36.5 C), temperature source Oral, resp. rate 16, height 5\' 10"  (1.778 m), weight 113.4 kg, SpO2 100 %.Body mass index is 35.87 kg/m.  General Appearance: Casual  Eye Contact::  Good  Speech:  Clear and Coherent  Volume:  Normal  Mood:  Euthymic  Affect:  Appropriate  Thought Process:  Coherent  Orientation:  Full (Time, Place, and Person)  Thought Content:  Logical  Suicidal Thoughts:  No  Homicidal Thoughts:  No  Memory:  Recent;   Fair  Judgement:  Fair  Insight:  Fair  Psychomotor Activity:  Normal  Concentration:  Fair  Recall:  [phone] of Knowledge:Good  Language: Good  Akathisia:  No  Handed:  Right  AIMS (if indicated):     Assets:  Desire for Improvement Leisure Time Physical Health  Sleep:  Number of Hours: 6.75  Cognition: WNL  ADL's:  Intact   Mental Status Per Nursing Assessment::   On Admission:  Suicide plan  Demographic Factors:  Male  Loss Factors: Decline in physical health  Historical Factors: Impulsivity  Risk Reduction Factors:   Religious beliefs about death and Positive therapeutic relationship  Continued Clinical Symptoms:  Alcohol/Substance Abuse/Dependencies  Cognitive Features That Contribute To Risk:  None    Suicide Risk:  Minimal: No identifiable suicidal ideation.  Patients presenting with no risk factors but with morbid ruminations; may be classified as minimal risk based on the severity of the depressive symptoms    Follow-up Information    [facility] Follow up.   Contact information: [facility] [address] [place], [place], [place]  Phone: [phone]              Plan Of Care/Follow-up recommendations:  Other:  Stay SOber from substances and alcohol. Follow-up with outpatient services at [facility].  [facility], MD 11/28/2019, 9:45 AM

## 2019-11-28 NOTE — Tx Team (Signed)
Interdisciplinary Treatment and Diagnostic Plan Update  11/28/2019 Time of Session: 9:40am Jonathan Gomez MRN: [id number]  Principal Diagnosis: Alcohol use disorder, severe, dependence (HCC)  Secondary Diagnoses: Principal Problem:   Alcohol use disorder, severe, dependence (HCC)   Current Medications:  Current Facility-Administered Medications  Medication Dose Route Frequency Provider Last Rate Last Admin  . acetaminophen (TYLENOL) tablet 650 mg  650 mg Oral Q6H PRN Antonieta Pert, MD      . alum & mag hydroxide-simeth (MAALOX/MYLANTA) 200-200-20 MG/5ML suspension 30 mL  30 mL Oral Q4H PRN Antonieta Pert, MD      . carvedilol (COREG) tablet 25 mg  25 mg Oral BID WC Antonieta Pert, MD   25 mg at 11/28/19 0741  . cloNIDine (CATAPRES) tablet 0.1 mg  0.1 mg Oral BID Antonieta Pert, MD   0.1 mg at 11/28/19 0741  . cyclobenzaprine (FLEXERIL) tablet 10 mg  10 mg Oral TID PRN Antonieta Pert, MD      . folic acid (FOLVITE) tablet 1 mg  1 mg Oral Daily Antonieta Pert, MD   1 mg at 11/28/19 0488  . hydrOXYzine (ATARAX/VISTARIL) tablet 25 mg  25 mg Oral TID PRN Antonieta Pert, MD   25 mg at 11/27/19 2136  . LORazepam (ATIVAN) tablet 1 mg  1 mg Oral Q6H PRN Antonieta Pert, MD      . risperiDONE (RISPERDAL M-TABS) disintegrating tablet 2 mg  2 mg Oral Q8H PRN Antonieta Pert, MD       And  . LORazepam (ATIVAN) tablet 1 mg  1 mg Oral PRN Antonieta Pert, MD       And  . ziprasidone (GEODON) injection 20 mg  20 mg Intramuscular PRN Antonieta Pert, MD      . magnesium hydroxide (MILK OF MAGNESIA) suspension 30 mL  30 mL Oral Daily PRN Antonieta Pert, MD      . naltrexone (DEPADE) tablet 50 mg  50 mg Oral Daily Antonieta Pert, MD   50 mg at 11/28/19 0742  . nicotine (NICODERM CQ - dosed in mg/24 hours) patch 21 mg  21 mg Transdermal Daily Antonieta Pert, MD   21 mg at 11/28/19 0743  . pantoprazole (PROTONIX) EC tablet 40 mg  40 mg Oral Daily Antonieta Pert, MD   40 mg at 11/28/19 0743  . prazosin (MINIPRESS) capsule 5 mg  5 mg Oral QHS Antonieta Pert, MD   5 mg at 11/27/19 2136  . rosuvastatin (CRESTOR) tablet 5 mg  5 mg Oral Daily Antonieta Pert, MD   5 mg at 11/28/19 0744  . sertraline (ZOLOFT) tablet 100 mg  100 mg Oral Daily Antonieta Pert, MD   100 mg at 11/28/19 0744  . thiamine tablet 100 mg  100 mg Oral Daily Antonieta Pert, MD   100 mg at 11/28/19 0744  . traZODone (DESYREL) tablet 100 mg  100 mg Oral QHS PRN Antonieta Pert, MD   100 mg at 11/27/19 2136  . traZODone (DESYREL) tablet 50 mg  50 mg Oral QHS PRN Antonieta Pert, MD   50 mg at 11/25/19 2119   PTA Medications: Medications Prior to Admission  Medication Sig Dispense Refill Last Dose  . amitriptyline (ELAVIL) 25 MG tablet Take 25 mg by mouth See admin instructions. Take 1/2 tablet by mouth twice a day at 7am and 9 pm     . amLODipine (NORVASC) 10  MG tablet Take by mouth.     . carvedilol (COREG) 12.5 MG tablet Take 12.5 mg by mouth 2 (two) times daily with a meal.     . cyclobenzaprine (FLEXERIL) 10 MG tablet Take 10 mg by mouth 3 (three) times daily as needed for muscle spasms.     . folic acid (FOLVITE) 1 MG tablet Take by mouth.     . naltrexone (DEPADE) 50 MG tablet Take 50 mg by mouth daily.     Marland Kitchen omeprazole (PRILOSEC) 20 MG capsule Take 20 mg by mouth daily.     . prazosin (MINIPRESS) 5 MG capsule Take by mouth.     . rosuvastatin (CRESTOR) 5 MG tablet Take by mouth.     . sertraline (ZOLOFT) 100 MG tablet Take by mouth.     . traZODone (DESYREL) 100 MG tablet Take 100 mg by mouth at bedtime.       Patient Stressors: Financial difficulties Health problems Marital or family conflict Substance abuse  Patient Strengths: Ability for insight Average or above average intelligence Communication skills Supportive family/friends  Treatment Modalities: Medication Management, Group therapy, Case management,  1 to 1 session with  clinician, Psychoeducation, Recreational therapy.   Physician Treatment Plan for Primary Diagnosis: Alcohol use disorder, severe, dependence (HCC) Long Term Goal(s): Improvement in symptoms so as ready for discharge Improvement in symptoms so as ready for discharge   Short Term Goals: Ability to identify changes in lifestyle to reduce recurrence of condition will improve Ability to verbalize feelings will improve Ability to disclose and discuss suicidal ideas Ability to demonstrate self-control will improve Ability to identify and develop effective coping behaviors will improve Ability to maintain clinical measurements within normal limits will improve Compliance with prescribed medications will improve Ability to identify triggers associated with substance abuse/mental health issues will improve Ability to identify changes in lifestyle to reduce recurrence of condition will improve Ability to verbalize feelings will improve Ability to disclose and discuss suicidal ideas Ability to demonstrate self-control will improve Ability to identify and develop effective coping behaviors will improve Ability to maintain clinical measurements within normal limits will improve Compliance with prescribed medications will improve Ability to identify triggers associated with substance abuse/mental health issues will improve  Medication Management: Evaluate patient's response, side effects, and tolerance of medication regimen.  Therapeutic Interventions: 1 to 1 sessions, Unit Group sessions and Medication administration.  Evaluation of Outcomes: Adequate for Discharge  Physician Treatment Plan for Secondary Diagnosis: Principal Problem:   Alcohol use disorder, severe, dependence (HCC)  Long Term Goal(s): Improvement in symptoms so as ready for discharge Improvement in symptoms so as ready for discharge   Short Term Goals: Ability to identify changes in lifestyle to reduce recurrence of condition  will improve Ability to verbalize feelings will improve Ability to disclose and discuss suicidal ideas Ability to demonstrate self-control will improve Ability to identify and develop effective coping behaviors will improve Ability to maintain clinical measurements within normal limits will improve Compliance with prescribed medications will improve Ability to identify triggers associated with substance abuse/mental health issues will improve Ability to identify changes in lifestyle to reduce recurrence of condition will improve Ability to verbalize feelings will improve Ability to disclose and discuss suicidal ideas Ability to demonstrate self-control will improve Ability to identify and develop effective coping behaviors will improve Ability to maintain clinical measurements within normal limits will improve Compliance with prescribed medications will improve Ability to identify triggers associated with substance abuse/mental health issues will improve  Medication Management: Evaluate patient's response, side effects, and tolerance of medication regimen.  Therapeutic Interventions: 1 to 1 sessions, Unit Group sessions and Medication administration.  Evaluation of Outcomes: Adequate for Discharge   RN Treatment Plan for Primary Diagnosis: Alcohol use disorder, severe, dependence (HCC) Long Term Goal(s): Knowledge of disease and therapeutic regimen to maintain health will improve  Short Term Goals: Ability to remain free from injury will improve, Ability to demonstrate self-control, Ability to participate in decision making will improve, Ability to verbalize feelings will improve, Ability to disclose and discuss suicidal ideas, Ability to identify and develop effective coping behaviors will improve and Compliance with prescribed medications will improve  Medication Management: RN will administer medications as ordered by provider, will assess and evaluate patient's response and  provide education to patient for prescribed medication. RN will report any adverse and/or side effects to prescribing provider.  Therapeutic Interventions: 1 on 1 counseling sessions, Psychoeducation, Medication administration, Evaluate responses to treatment, Monitor vital signs and CBGs as ordered, Perform/monitor CIWA, COWS, AIMS and Fall Risk screenings as ordered, Perform wound care treatments as ordered.  Evaluation of Outcomes: Adequate for Discharge   LCSW Treatment Plan for Primary Diagnosis: Alcohol use disorder, severe, dependence (HCC) Long Term Goal(s): Safe transition to appropriate next level of care at discharge, Engage patient in therapeutic group addressing interpersonal concerns.  Short Term Goals: Engage patient in aftercare planning with referrals and resources, Increase social support, Increase ability to appropriately verbalize feelings, Increase emotional regulation, Facilitate acceptance of mental health diagnosis and concerns, Facilitate patient progression through stages of change regarding substance use diagnoses and concerns and Identify triggers associated with mental health/substance abuse issues  Therapeutic Interventions: Assess for all discharge needs, 1 to 1 time with Social worker, Explore available resources and support systems, Assess for adequacy in community support network, Educate family and significant other(s) on suicide prevention, Complete Psychosocial Assessment, Interpersonal group therapy.  Evaluation of Outcomes: Adequate for Discharge   Progress in Treatment: Attending groups: Yes. Participating in groups: Yes. Taking medication as prescribed: Yes. Toleration medication: Yes. Family/Significant other contact made: Yes, individual(s) contacted:  Girlfriend  Patient understands diagnosis: Yes. Discussing patient identified problems/goals with staff: Yes. Medical problems stabilized or resolved: Yes. Denies suicidal/homicidal ideation:  Yes. Issues/concerns per patient self-inventory: No.   New problem(s) identified: No, Describe:  None   New Short Term/Long Term Goal(s): medication stabilization, elimination of SI thoughts, development of comprehensive mental wellness plan.   Patient Goals:  "To stay focused and on track"   Discharge Plan or Barriers: Patient recently admitted. CSW will continue to follow and assess for appropriate referrals and possible discharge planning.   Reason for Continuation of Hospitalization: Medication stabilization  Estimated Length of Stay: Adequate for discharge   Attendees: Patient: Jonathan Gomez  11/28/2019   Physician: Pricilla Larsson, MD 11/28/2019   Nursing:  11/28/2019   RN Care Manager: 11/28/2019   Social Worker: Melba Coon, LCSWA 11/28/2019   Recreational Therapist:  11/28/2019   Other:  11/28/2019   Other:  11/28/2019   Other: 11/28/2019      Scribe for Treatment Team: Aram Beecham, LCSWA 11/28/2019 9:46 AM

## 2019-11-28 NOTE — Progress Notes (Signed)
Discharge Note:  Patient discharged home with Safe Transport.  Suicide prevention information given and discussed with patient who stated he understood and had no questions.  Patient stated he received all his belongings, clothing, toiletries, misc items, etc. Patient stated he appreciated all assistance received from [facility] staff.  Patient denied SI and HI.  Denied A/V hallucinations.

## 2019-11-28 NOTE — Plan of Care (Signed)
Nurse discussed anxiety, depression and coping skills with patient.  

## 2019-11-28 NOTE — Progress Notes (Signed)
  [facility] Adult Case Management Discharge Plan :  Will you be returning to the same living situation after discharge:  Yes,  home At discharge, do you have transportation home?: Yes,  via wife Do you have the ability to pay for your medications: Yes,  has mcd  Release of information consent forms completed and in the chart;  Patient's signature needed at discharge.  Patient to Follow up at:  Follow-up Information    [employer]. Call.   Why: Please contact this provider to confirm and/or schedule an appointment for therapy and medication management services.  Contact information: [facility] [address] [place], [place], [place] [address]  Phone: [phone] Fax:  [phone]              Next level of care provider has access to [facility] Link:no  Safety Planning and Suicide Prevention discussed: No. unable to reach pt's wife. Pamphlet placed in chart.     Has patient been referred to the Quitline?: Patient refused referral  Patient has been referred for addiction treatment: N/A  Felizardo Hoffmann, Theresia Majors 11/28/2019, 10:39 AM

## 2019-11-28 NOTE — Progress Notes (Signed)
Patient has been up in the dayroom this evening. He attended group this evening and has voiced no complaints. Patient currently denies having pain, -si/hi/a/v hall. Support and encouragement offered, safety maintained on unit, will continue to monitor.

## 2019-11-28 NOTE — [facility] Group Notes (Signed)
Adult Psychoeducational Group Note  Date:  11/28/2019 Time:  2:40 PM  Group Topic/Focus:  Orientation:   The focus of this group is to educate the patient on the purpose and policies of crisis stabilization and provide a format to answer questions about their admission.  The group details unit policies and expectations of patients while admitted.  Participation Level:  Active  Participation Quality:  Appropriate  Affect:  Appropriate  Cognitive:  Appropriate  Insight: Appropriate  Engagement in Group:  Engaged  Modes of Intervention:  Orientation  Additional Comments:  Patient attended Orientation and participated.   [name] W [name] 11/28/2019, 2:40 PM

## 2019-11-28 NOTE — Progress Notes (Signed)
Recreation Therapy Notes  Date:  11.8.21 Time: 0930 Location: [address] Hall Dayroom  Group Topic: Stress Management  Goal Area(s) Addresses:  Patient will identify positive stress management techniques. Patient will identify benefits of using stress management post d/c.  Behavioral Response: Engaged  Intervention: Stress Management  Activity: Meditation.  LRT played a meditation that focused on being resilient in the face of adversity.  Patients were to listen and follow as meditation was played to fully engage in activity.    Education:  Stress Management, Discharge Planning.   Education Outcome: Acknowledges Education  Clinical Observations/Feedback: Pt attended and participated in activity.    Caroll Rancher, LRT/CTRS         Caroll Rancher A 11/28/2019 11:45 AM

## 2019-11-28 NOTE — Progress Notes (Signed)
D:  Patient's self inventory sheet, patient sleeps good, no sleep medication.  Good appetite, normal energy level, good concentration.  Denied depression, hopeless, anxiety.  Denied withdrawals.  Denied SI.  Denied physical problems.  Denied physical pain.  Goal is stay on track.  Plans to be alert.  Does have discharge plans. A:  Medications administered per MD orders.  Emotional support and encouragement given patient. R:  Denied SI and HI, contracts for safety.  Denied A/V hallucinations.  Safety maintained with 15 minute checks.

## 2019-11-28 NOTE — Discharge Summary (Signed)
Physician Discharge Summary Note  Patient:  Jonathan Gomez is an 44 y.o., male MRN:  [id number] DOB:  10-Mar-1975 Patient phone:  [phone] (home)  Patient address:   [address] [place] [address],  Total Time spent with patient: 30 minutes  Date of Admission:  11/25/2019 Date of Discharge: 11/28/2019  Reason for Admission:  Suicidal ideation, Alcohol abuse  Principal Problem: Alcohol use disorder, severe, dependence (HCC) Discharge Diagnoses: Principal Problem:   Alcohol use disorder, severe, dependence (HCC)  HPI: " Patient is seen and examined. Patient is a 44 year old male with a past psychiatric history significant for posttraumatic stress disorder, depression and alcohol use disorder who originally presented to the [facility] emergency department on 11/23/2019 with suicidal ideation.The patient stated that he had had suicidal ideation over the weekend. He stated that he had had unspecified triggers that made him think about his previous trauma. He stated that he had had several tours in [place] as well as [place], and saw multiple dead bodies. He started thinking about that again and relapsed on alcohol. 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 He stated that he had been at the [facility] in [place] once in 2020 and once in 2021 for alcohol related issues. He apparently was there for at least 30 days. He mentions something about schizophrenia in the clinical comprehensive evaluation note, but review of the electronic medical record did not reveal any previous history of schizophrenia. He apparently had developed some chest pain, and was kept in the emergency department. His cardiac enzymes were negative. CT scan of the chest did not reveal any obvious pulmonary emboli. He was felt to be cleared psychiatrically for transfer. Request were sent to the [facility], but apparently no acute beds were  available. Request were also sent to the [facility] but apparently they also were on diversion. The patient tells me that he is 100% service-connected veteran. He was transferred to our facility for evaluation and stabilization. The patient denied suicidal or homicidal ideation. He denied auditory and visual hallucinations. He stated that he had relapsed on alcohol, and understands the things that he needs to get back involved with. Review of his laboratories on admission did show an MCV was decreased at 76, and MCH that was decreased to 25.2. His liver function enzymes were significantly elevated. His AST was 254, and his ALT was 77. His blood alcohol on admission was 254. Drug screen was positive for marijuana. He was transferred to our facility for evaluation and stabilization.   Associated Signs/Symptoms: Depression Symptoms:  depressed mood, anhedonia, insomnia, psychomotor agitation, fatigue, feelings of worthlessness/guilt, difficulty concentrating, hopelessness, suicidal thoughts without plan, anxiety, disturbed sleep, Duration of Depression Symptoms: No data recorded (Hypo) Manic Symptoms:  Hallucinations, Impulsivity, Irritable Mood, Labiality of Mood, Anxiety Symptoms:  Excessive Worry, Psychotic Symptoms:  Delusions, Hallucinations: Auditory Paranoia, Duration of Psychotic Symptoms: Greater than six months  PTSD Symptoms: Had a traumatic exposure:  In the [employer]   Past Psychiatric History: Patient has been involved with the veterans administrative psychiatric services since his discharge from the [employer].  His most recent exposure to their systems is as an outpatient, but also has been at the veterans [facility] hospital facility in [place], [place] for detox as well as substance abuse residential treatment.  "  Past Medical History:  Past Medical History:  Diagnosis Date  . Hypertension   . PTSD (post-traumatic stress disorder)   .  Seizures  (HCC)    History reviewed. No pertinent surgical history. Family History: History reviewed. No pertinent family history. Family Psychiatric  History:  Social History:  Social History   Substance and Sexual Activity  Alcohol Use Yes     Social History   Substance and Sexual Activity  Drug Use Not on file    Social History   Socioeconomic History  . Marital status: Single    Spouse name: Not on file  . Number of children: Not on file  . Years of education: Not on file  . Highest education level: Not on file  Occupational History  . Not on file  Tobacco Use  . Smoking status: Current Every Day Smoker    Packs/day: 1.00    Types: Cigarettes  . Smokeless tobacco: Never Used  Substance and Sexual Activity  . Alcohol use: Yes  . Drug use: Not on file  . Sexual activity: Not on file  Other Topics Concern  . Not on file  Social History Narrative  . Not on file   Social Determinants of Health   Financial Resource Strain:   . Difficulty of Paying Living Expenses: Not on file  Food Insecurity:   . Worried About Programme researcher, broadcasting/film/videounning Out of Food in the Last Year: Not on file  . Ran Out of Food in the Last Year: Not on file  Transportation Needs:   . Lack of Transportation (Medical): Not on file  . Lack of Transportation (Non-Medical): Not on file  Physical Activity:   . Days of Exercise per Week: Not on file  . Minutes of Exercise per Session: Not on file  Stress:   . Feeling of Stress : Not on file  Social Connections:   . Frequency of Communication with Friends and Family: Not on file  . Frequency of Social Gatherings with Friends and Family: Not on file  . Attends Religious Services: Not on file  . Active Member of Clubs or Organizations: Not on file  . Attends BankerClub or Organization Meetings: Not on file  . Marital Status: Not on file    Hospital Course:    Jonathan Gomez was admitted to the adult 300 unit. He was evaluated and his symptoms were identified. Medication  management was discussed and initiated. He was continued on his medications including Zoloft, naltrexone, prazosin. His blood pressure medication was adjusted during this hospitalization. He was oriented to the unit and encouraged to participate in unit programming. Medical problems were identified and treated appropriately. Home medication was restarted as needed.        The patient was evaluated each day by a clinical provider to ascertain the patient's response to treatment.  Improvement was noted by the patient's report of decreasing symptoms, improved sleep and appetite, affect, medication tolerance, behavior, and participation in unit programming.  He was asked each day to complete a self inventory noting mood, mental status, pain, new symptoms, anxiety and concerns.         He responded well to medication and being in a therapeutic and supportive environment. Positive and appropriate behavior was noted and the patient was motivated for recovery.  The patient worked closely with the treatment team and case manager to develop a discharge plan with appropriate goals. Coping skills, problem solving as well as relaxation therapies were also part of the unit programming.         By the day of discharge he was in much improved condition than upon admission.  Symptoms were reported  as significantly decreased or resolved completely. The patient denied SI/HI and voiced no AVH. He was motivated to continue taking medication with a goal of continued improvement in mental health.          Jonathan Gomez was discharged home with a plan to follow up as noted below.  Physical Findings: AIMS: Facial and Oral Movements Muscles of Facial Expression: None, normal Lips and Perioral Area: None, normal Jaw: None, normal Tongue: None, normal,Extremity Movements Upper (arms, wrists, hands, fingers): None, normal Lower (legs, knees, ankles, toes): None, normal, Trunk Movements Neck, shoulders, hips: None, normal, Overall  Severity Severity of abnormal movements (highest score from questions above): None, normal Incapacitation due to abnormal movements: None, normal Patient's awareness of abnormal movements (rate only patient's report): No Awareness, Dental Status Current problems with teeth and/or dentures?: No Does patient usually wear dentures?: No  CIWA:  CIWA-Ar Total: 2 COWS:     Musculoskeletal: Strength & Muscle Tone: within normal limits Gait & Station: normal Patient leans: N/A  Psychiatric Specialty Exam: Physical Exam  Review of Systems  Blood pressure (!) 114/93, pulse 96, temperature 97.7 F (36.5 C), temperature source Oral, resp. rate 16, height 5\' 10"  (1.778 m), weight 113.4 kg, SpO2 100 %.Body mass index is 35.87 kg/m.  General Appearance: Casual  Eye Contact:  Good  Speech:  Normal Rate  Volume:  Normal  Mood:  Euthymic  Affect:  Appropriate  Thought Process:  Coherent  Orientation:  Full (Time, Place, and Person)  Thought Content:  Logical  Suicidal Thoughts:  No  Homicidal Thoughts:  No  Memory:  Recent;   Fair  Judgement:  Fair  Insight:  Fair  Psychomotor Activity:  Normal  Concentration:  Concentration: Fair  Recall:  Fair  Fund of Knowledge:  Fair  Language:  Fair  Akathisia:  No  Handed:  Right  AIMS (if indicated):     Assets:  Leisure Time Physical Health  ADL's:  Intact  Cognition:  WNL  Sleep:  Number of Hours: 6.75        Has this patient used any form of tobacco in the last 30 days? (Cigarettes, Smokeless Tobacco, Cigars, and/or Pipes) Yes, Yes, A prescription for an FDA-approved tobacco cessation medication was offered at discharge and the patient refused  Blood Alcohol level:  Lab Results  Component Value Date   ETH 254 (H) 11/23/2019    Metabolic Disorder Labs:  Lab Results  Component Value Date   HGBA1C 5.5 11/26/2019   MPG 111.15 11/26/2019   No results found for: PROLACTIN Lab Results  Component Value Date   CHOL 169 11/26/2019    TRIG 207 (H) 11/26/2019   HDL 45 11/26/2019   CHOLHDL 3.8 11/26/2019   VLDL 41 (H) 11/26/2019   LDLCALC 83 11/26/2019    See Psychiatric Specialty Exam and Suicide Risk Assessment completed by Attending Physician prior to discharge.  Discharge destination:  Home  Is patient on multiple antipsychotic therapies at discharge:  No   Has Patient had three or more failed trials of antipsychotic monotherapy by history:  No  Recommended Plan for Multiple Antipsychotic Therapies: NA  Discharge Instructions    Discharge instructions   Complete by: As directed    Activity as tolerated. Diet as recommended by primary care physician. Keep all scheduled follow-up appointments as recommended.     Allergies as of 11/28/2019      Reactions   Lisinopril Swelling      Medication List    STOP  taking these medications   amitriptyline 25 MG tablet Commonly known as: ELAVIL   amLODipine 10 MG tablet Commonly known as: NORVASC   cyclobenzaprine 10 MG tablet Commonly known as: FLEXERIL   folic acid 1 MG tablet Commonly known as: FOLVITE   traZODone 100 MG tablet Commonly known as: DESYREL     TAKE these medications     Indication  carvedilol 25 MG tablet Commonly known as: COREG Take 1 tablet (25 mg total) by mouth 2 (two) times daily with a meal. What changed:   medication strength  how much to take  Indication: High Blood Pressure Disorder   cloNIDine 0.1 MG tablet Commonly known as: CATAPRES Take 1 tablet (0.1 mg total) by mouth 2 (two) times daily.  Indication: High Blood Pressure Disorder   naltrexone 50 MG tablet Commonly known as: DEPADE Take 50 mg by mouth daily.  Indication: Abuse or Misuse of Alcohol   omeprazole 20 MG capsule Commonly known as: PRILOSEC Take 20 mg by mouth daily.  Indication: Gastroesophageal Reflux Disease   prazosin 5 MG capsule Commonly known as: MINIPRESS Take 1 capsule (5 mg total) by mouth at bedtime. What changed:   how much to  take  when to take this  Indication: Frightening Dreams   rosuvastatin 5 MG tablet Commonly known as: CRESTOR Take 1 tablet (5 mg total) by mouth daily. Start taking on: November 29, 2019 What changed:   how much to take  when to take this  Indication: High Amount of Fats in the Blood   sertraline 100 MG tablet Commonly known as: ZOLOFT Take 1 tablet (100 mg total) by mouth daily. Start taking on: November 29, 2019 What changed:   how much to take  when to take this  Indication: Major Depressive Disorder       Follow-up Information    [employer] Call.   Why: Please contact this provider to confirm and/or schedule an appointment for therapy and medication management services.  Contact information: [facility] [address] [place], [place], [place] [address]  Phone: [phone] Fax:  [phone]              Follow-up recommendations:  Other:  Follow up with outpatient psychiatric care    Signed: Clement Sayres, MD 11/28/2019, 10:46 AM

## 2019-11-28 NOTE — Progress Notes (Signed)
Adult Psychoeducational Group Note  Date:  11/28/2019 Time:  10:50 AM  Group Topic/Focus:  Orientation:   The focus of this group is to educate the patient on the purpose and policies of crisis stabilization and provide a format to answer questions about their admission.  The group details unit policies and expectations of patients while admitted.  Participation Level:  Active  Participation Quality:  Appropriate  Affect:  Appropriate  Cognitive:  Appropriate  Insight: Appropriate  Engagement in Group:  Engaged  Modes of Intervention:  Orientation  Additional Comments:  Patient attended orientation group and participated.   Quanta Roher W Myley Bahner 11/28/2019, 10:50 AM

## 2022-03-22 IMAGING — CT CT ANGIO CHEST
2 of 6 series · 18 of 46 positions shown · IV contrast (Omnipaque or Isovue)
Comparison: None.

CLINICAL DATA: Elevated heart rate and troponin.

EXAM:
CT ANGIOGRAPHY CHEST WITH CONTRAST
TECHNIQUE: Multidetector CT imaging of the chest was performed using the
standard protocol during bolus administration of intravenous
contrast. Multiplanar CT image reconstructions and MIPs were
obtained to evaluate the vascular anatomy.
CONTRAST:  100mL OMNIPAQUE IOHEXOL 350 MG/ML SOLN

[Series 5: pe axial thins · axial · 0.64mm/px · z∈[+1429,+1660]mm · 15 of 254 slices shown]
[im 12/254  lung]
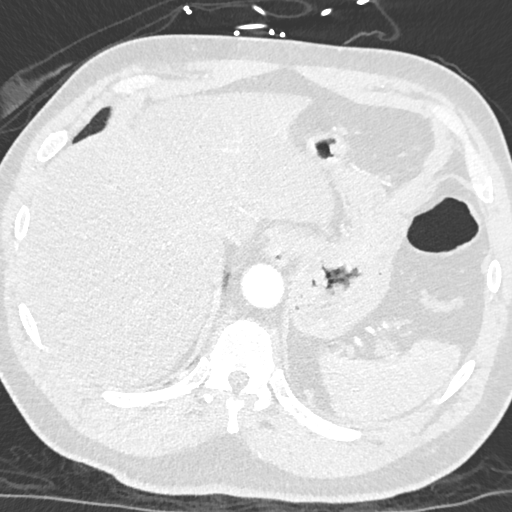
[im 34/254  soft-tissue]
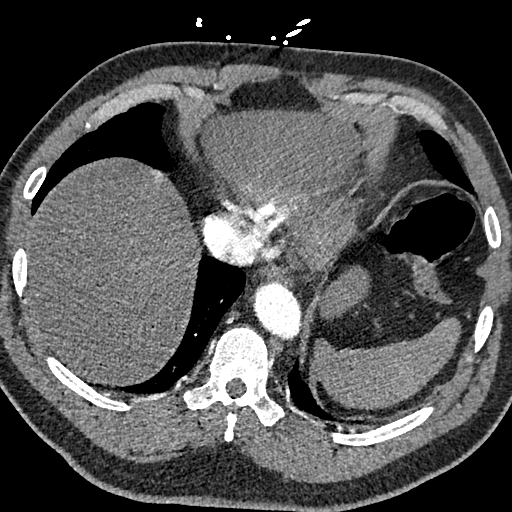
[im 45/254  lung]
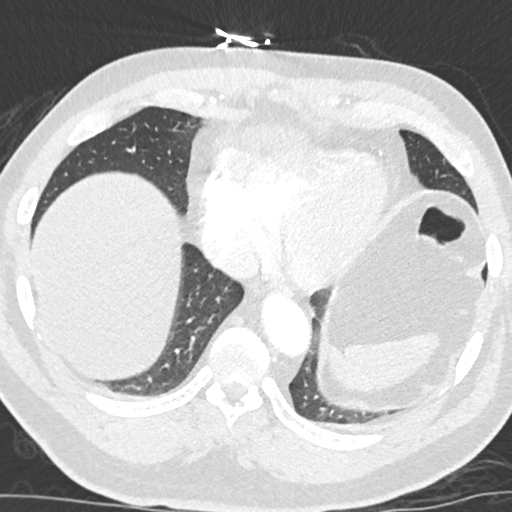
[im 67/254  soft-tissue]
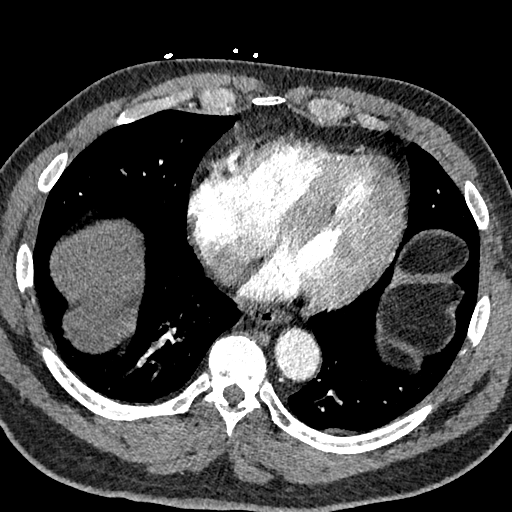
[im 78/254  lung]
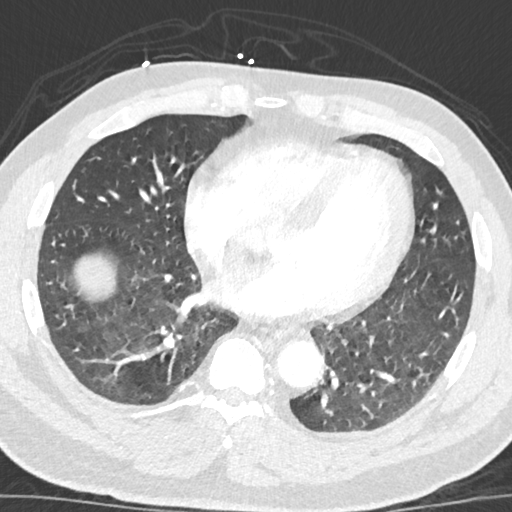
[im 100/254  soft-tissue]
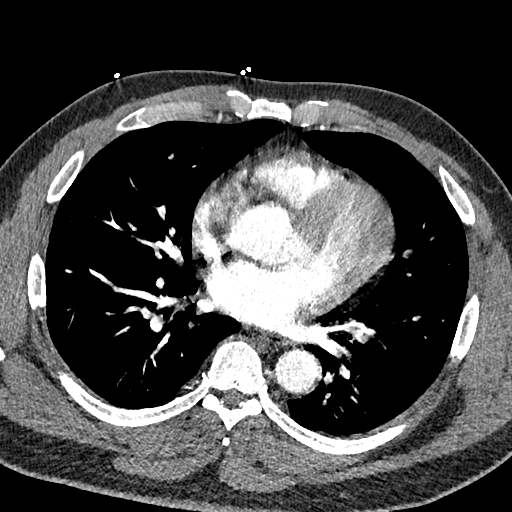
[im 111/254  lung]
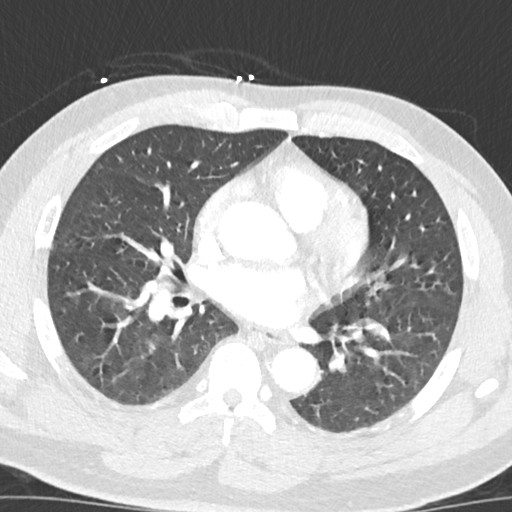
[im 133/254  soft-tissue]
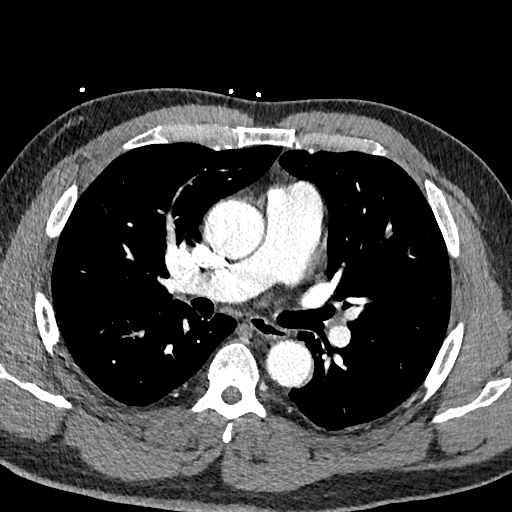
[im 144/254  lung]
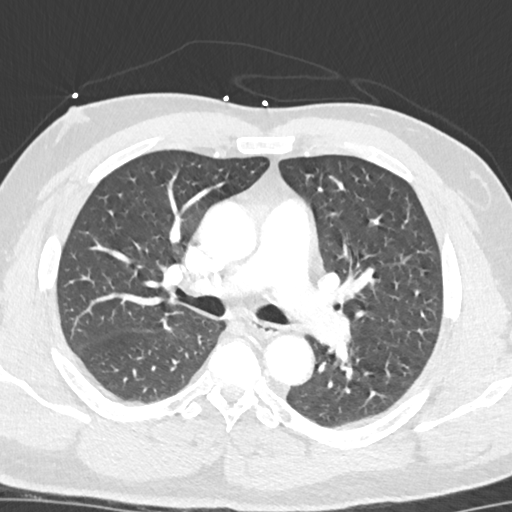
[im 155/254  soft-tissue]
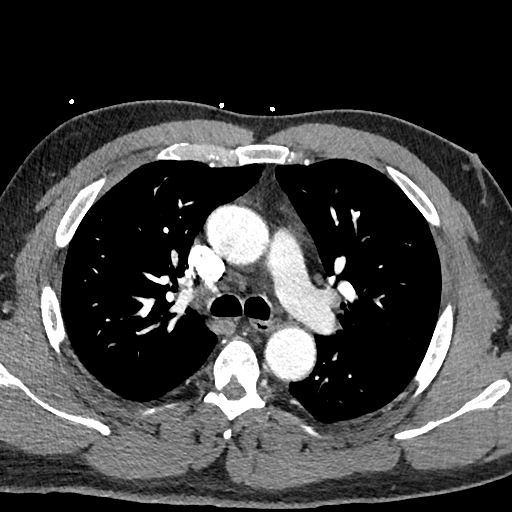
[im 177/254  lung]
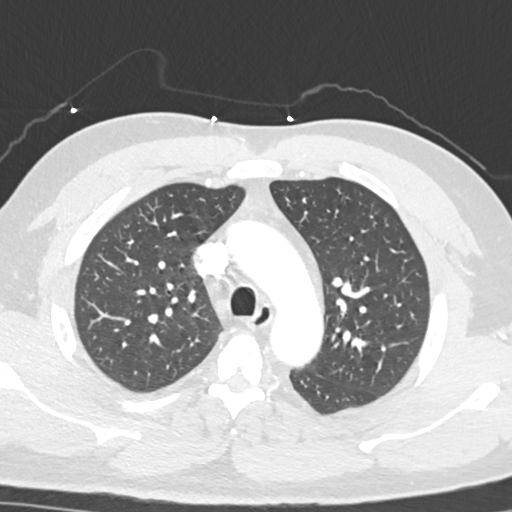
[im 188/254  soft-tissue]
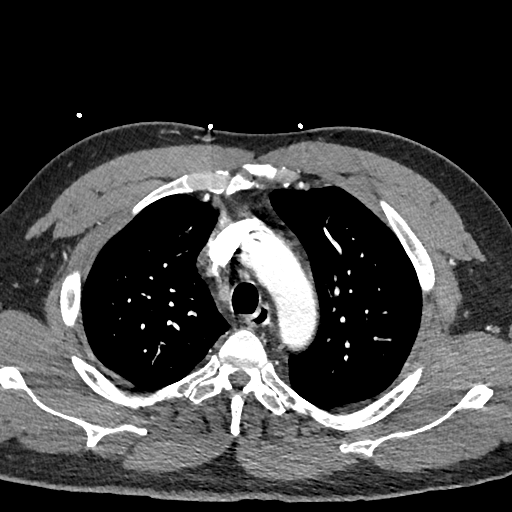
[im 210/254  lung]
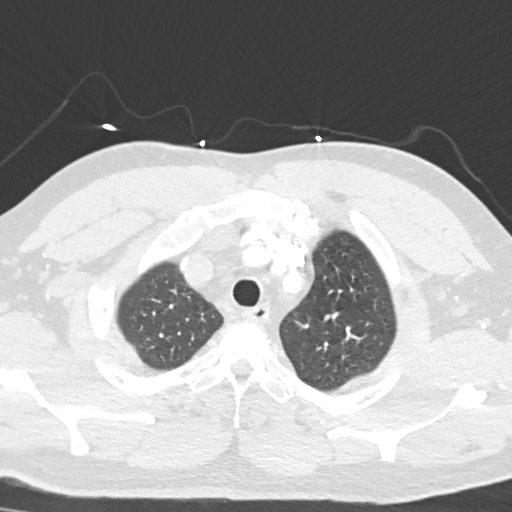
[im 221/254  soft-tissue]
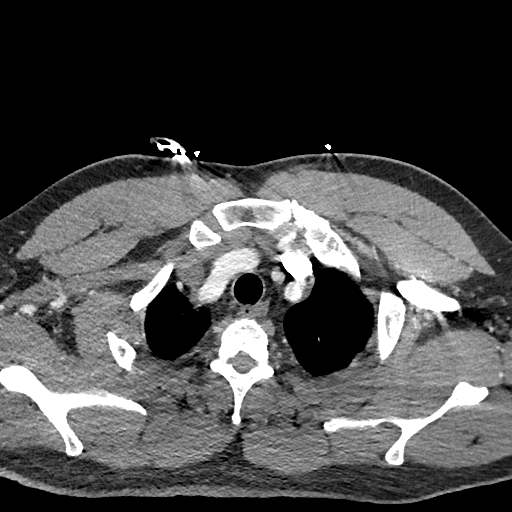
[im 243/254  lung]
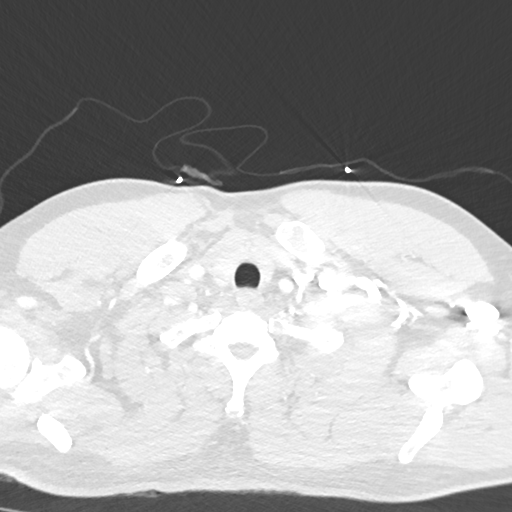

[Series 8: cor soft · coronal · 0.50mm/px · 3 of 130 slices shown]
[im 33/130  soft-tissue]
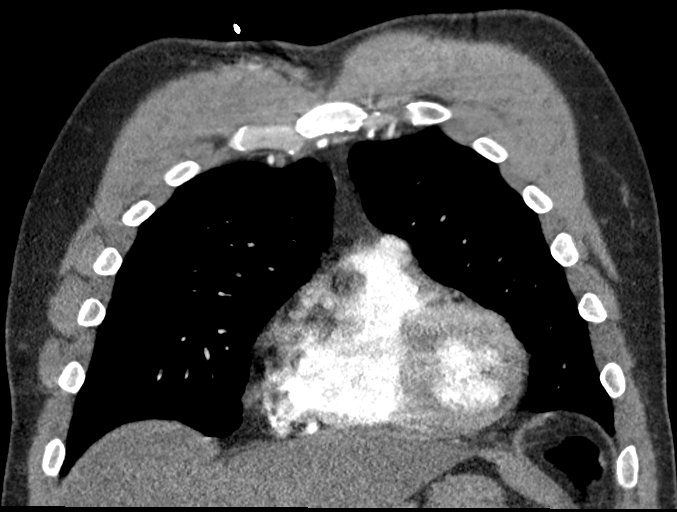
[im 65/130  soft-tissue]
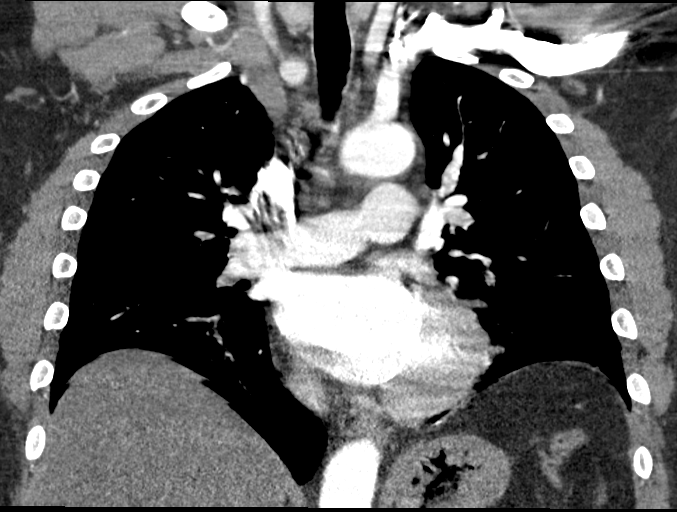
[im 97/130  soft-tissue]
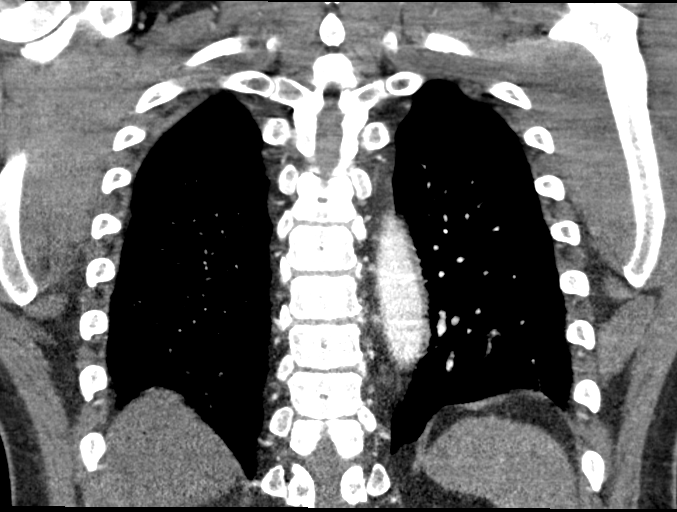

[18 of 46 positions shown; findings below may reference images not displayed]

FINDINGS: Cardiovascular: Suboptimal opacification of the pulmonary arteries
due to bolus dispersion and streak artifact. No evidence of
pulmonary embolism. Normal heart size. No pericardial effusion.

Mediastinum/Nodes: Negative for adenopathy or mass.

Lungs/Pleura: There is no edema, consolidation, effusion, or
pneumothorax. Calcified granuloma in the right middle lobe

Upper Abdomen: No acute abnormality.

Musculoskeletal: No chest wall abnormality. No acute or significant
osseous findings.

Review of the MIP images confirms the above findings.
IMPRESSION: Suboptimal pulmonary artery opacification, especially beyond the
proximal segmental level. No evidence of pulmonary embolism or other
acute process.
